# Patient Record
Sex: Male | Born: 1935 | Race: White | Hispanic: No | Smoking: Former smoker
Health system: Southern US, Community
[De-identification: ages and names within clinical notes are randomized; demographics above are authoritative.]

## PROBLEM LIST (undated history)

## (undated) DIAGNOSIS — I10 Essential (primary) hypertension: Secondary | ICD-10-CM

## (undated) DIAGNOSIS — M1990 Unspecified osteoarthritis, unspecified site: Secondary | ICD-10-CM

## (undated) DIAGNOSIS — K219 Gastro-esophageal reflux disease without esophagitis: Secondary | ICD-10-CM

## (undated) DIAGNOSIS — E7800 Pure hypercholesterolemia, unspecified: Secondary | ICD-10-CM

## (undated) DIAGNOSIS — E785 Hyperlipidemia, unspecified: Secondary | ICD-10-CM

## (undated) DIAGNOSIS — I83899 Varicose veins of unspecified lower extremities with other complications: Secondary | ICD-10-CM

## (undated) DIAGNOSIS — K469 Unspecified abdominal hernia without obstruction or gangrene: Secondary | ICD-10-CM

## (undated) DIAGNOSIS — N189 Chronic kidney disease, unspecified: Secondary | ICD-10-CM

## (undated) HISTORY — DX: Essential (primary) hypertension: I10

## (undated) HISTORY — PX: FOOT SURGERY: SHX648

## (undated) HISTORY — DX: Hyperlipidemia, unspecified: E78.5

## (undated) HISTORY — PX: HEMORROIDECTOMY: SUR656

## (undated) HISTORY — DX: Varicose veins of unspecified lower extremity with other complications: I83.899

## (undated) HISTORY — DX: Unspecified abdominal hernia without obstruction or gangrene: K46.9

## (undated) HISTORY — DX: Unspecified osteoarthritis, unspecified site: M19.90

## (undated) HISTORY — DX: Pure hypercholesterolemia, unspecified: E78.00

## (undated) HISTORY — DX: Gastro-esophageal reflux disease without esophagitis: K21.9

## (undated) HISTORY — DX: Chronic kidney disease, unspecified: N18.9

## (undated) HISTORY — PX: APPENDECTOMY: SHX54

## (undated) HISTORY — PX: VEIN LIGATION AND STRIPPING: SHX2653

## (undated) HISTORY — PX: CHOLECYSTECTOMY: SHX55

---

## 1997-06-04 ENCOUNTER — Other Ambulatory Visit: Admission: RE | Admit: 1997-06-04 | Discharge: 1997-06-04 | Payer: Self-pay | Admitting: *Deleted

## 1998-02-17 ENCOUNTER — Emergency Department (HOSPITAL_COMMUNITY): Admission: EM | Admit: 1998-02-17 | Discharge: 1998-02-17 | Payer: Self-pay | Admitting: Emergency Medicine

## 2000-02-08 ENCOUNTER — Emergency Department (HOSPITAL_COMMUNITY): Admission: EM | Admit: 2000-02-08 | Discharge: 2000-02-08 | Payer: Self-pay

## 2000-04-24 ENCOUNTER — Encounter: Payer: Self-pay | Admitting: Surgery

## 2000-04-24 ENCOUNTER — Encounter: Admission: RE | Admit: 2000-04-24 | Discharge: 2000-04-24 | Payer: Self-pay | Admitting: Surgery

## 2000-04-25 ENCOUNTER — Ambulatory Visit (HOSPITAL_BASED_OUTPATIENT_CLINIC_OR_DEPARTMENT_OTHER): Admission: RE | Admit: 2000-04-25 | Discharge: 2000-04-26 | Payer: Self-pay | Admitting: Surgery

## 2000-10-09 ENCOUNTER — Emergency Department (HOSPITAL_COMMUNITY): Admission: EM | Admit: 2000-10-09 | Discharge: 2000-10-09 | Payer: Self-pay | Admitting: Emergency Medicine

## 2001-10-23 ENCOUNTER — Emergency Department (HOSPITAL_COMMUNITY): Admission: EM | Admit: 2001-10-23 | Discharge: 2001-10-23 | Payer: Self-pay | Admitting: Emergency Medicine

## 2001-10-23 ENCOUNTER — Encounter: Payer: Self-pay | Admitting: Emergency Medicine

## 2003-10-04 ENCOUNTER — Emergency Department (HOSPITAL_COMMUNITY): Admission: EM | Admit: 2003-10-04 | Discharge: 2003-10-04 | Payer: Self-pay | Admitting: Emergency Medicine

## 2003-10-23 ENCOUNTER — Encounter (INDEPENDENT_AMBULATORY_CARE_PROVIDER_SITE_OTHER): Payer: Self-pay | Admitting: Specialist

## 2003-10-23 ENCOUNTER — Inpatient Hospital Stay (HOSPITAL_COMMUNITY): Admission: EM | Admit: 2003-10-23 | Discharge: 2003-10-28 | Payer: Self-pay | Admitting: Emergency Medicine

## 2004-03-07 ENCOUNTER — Emergency Department (HOSPITAL_COMMUNITY): Admission: EM | Admit: 2004-03-07 | Discharge: 2004-03-07 | Payer: Self-pay | Admitting: Emergency Medicine

## 2004-07-13 ENCOUNTER — Ambulatory Visit (HOSPITAL_COMMUNITY): Admission: RE | Admit: 2004-07-13 | Discharge: 2004-07-13 | Payer: Self-pay | Admitting: Gastroenterology

## 2004-09-30 ENCOUNTER — Inpatient Hospital Stay (HOSPITAL_COMMUNITY): Admission: RE | Admit: 2004-09-30 | Discharge: 2004-10-02 | Payer: Self-pay | Admitting: Internal Medicine

## 2005-08-23 ENCOUNTER — Emergency Department (HOSPITAL_COMMUNITY): Admission: EM | Admit: 2005-08-23 | Discharge: 2005-08-23 | Payer: Self-pay | Admitting: Emergency Medicine

## 2008-07-30 ENCOUNTER — Emergency Department (HOSPITAL_COMMUNITY): Admission: EM | Admit: 2008-07-30 | Discharge: 2008-07-30 | Payer: Self-pay | Admitting: Emergency Medicine

## 2009-04-07 ENCOUNTER — Emergency Department (HOSPITAL_COMMUNITY): Admission: EM | Admit: 2009-04-07 | Discharge: 2009-04-07 | Payer: Self-pay | Admitting: Emergency Medicine

## 2009-09-27 ENCOUNTER — Emergency Department (HOSPITAL_COMMUNITY): Admission: EM | Admit: 2009-09-27 | Discharge: 2009-09-27 | Payer: Self-pay | Admitting: Emergency Medicine

## 2009-10-20 ENCOUNTER — Inpatient Hospital Stay (HOSPITAL_COMMUNITY)
Admission: EM | Admit: 2009-10-20 | Discharge: 2009-10-24 | Payer: Self-pay | Source: Home / Self Care | Admitting: Emergency Medicine

## 2009-10-22 ENCOUNTER — Encounter (INDEPENDENT_AMBULATORY_CARE_PROVIDER_SITE_OTHER): Payer: Self-pay | Admitting: Internal Medicine

## 2009-11-15 ENCOUNTER — Emergency Department (HOSPITAL_COMMUNITY)
Admission: EM | Admit: 2009-11-15 | Discharge: 2009-11-16 | Payer: Self-pay | Source: Home / Self Care | Admitting: Emergency Medicine

## 2010-05-06 LAB — COMPREHENSIVE METABOLIC PANEL
ALT: 113 U/L — ABNORMAL HIGH (ref 0–53)
ALT: 166 U/L — ABNORMAL HIGH (ref 0–53)
Alkaline Phosphatase: 50 U/L (ref 39–117)
Alkaline Phosphatase: 52 U/L (ref 39–117)
BUN: 13 mg/dL (ref 6–23)
BUN: 17 mg/dL (ref 6–23)
CO2: 26 mEq/L (ref 19–32)
CO2: 28 mEq/L (ref 19–32)
Calcium: 8.9 mg/dL (ref 8.4–10.5)
Chloride: 103 mEq/L (ref 96–112)
Chloride: 105 mEq/L (ref 96–112)
GFR calc Af Amer: >60 mL/min (ref >60)
GFR calc non Af Amer: 47 mL/min — ABNORMAL LOW (ref >60)
GFR calc non Af Amer: 50 mL/min — ABNORMAL LOW (ref >60)
Glucose, Bld: 109 mg/dL — ABNORMAL HIGH (ref 70–99)
Glucose, Bld: 99 mg/dL (ref 70–99)
Potassium: 3.6 mEq/L (ref 3.5–5.1)
Sodium: 138 mEq/L (ref 135–145)
Total Bilirubin: 1.4 mg/dL — ABNORMAL HIGH (ref 0.3–1.2)
Total Protein: 6.1 g/dL (ref 6.0–8.3)
Total Protein: 6.3 g/dL (ref 6.0–8.3)

## 2010-05-06 LAB — DIFFERENTIAL
Basophils Absolute: 0 10*3/uL (ref 0.0–0.1)
Basophils Relative: 1 % (ref 0–1)
Basophils Relative: 0 % (ref 0–1)
Eosinophils Absolute: 0.2 10*3/uL (ref 0.0–0.7)
Lymphocytes Relative: 27 % (ref 12–46)
Lymphocytes Relative: 17 % (ref 12–46)
Lymphs Abs: 1.4 10*3/uL (ref 0.7–4.0)
Monocytes Absolute: 0.4 10*3/uL (ref 0.1–1.0)
Monocytes Absolute: 0.4 10*3/uL (ref 0.1–1.0)
Neutro Abs: 3.3 10*3/uL (ref 1.7–7.7)

## 2010-05-06 LAB — POCT I-STAT, CHEM 8
BUN: 30 mg/dL — ABNORMAL HIGH (ref 6–23)
Calcium, Ion: 1.23 mmol/L (ref 1.12–1.32)
Creatinine, Ser: 1.7 mg/dL — ABNORMAL HIGH (ref 0.4–1.5)
Glucose, Bld: 107 mg/dL — ABNORMAL HIGH (ref 70–99)
Potassium: 4.2 mEq/L (ref 3.5–5.1)
Sodium: 140 mEq/L (ref 135–145)

## 2010-05-06 LAB — CBC
HCT: 36.2 % — ABNORMAL LOW (ref 39.0–52.0)
HCT: 36.3 % — ABNORMAL LOW (ref 39.0–52.0)
HCT: 36.6 % — ABNORMAL LOW (ref 39.0–52.0)
Hemoglobin: 12.6 g/dL — ABNORMAL LOW (ref 13.0–17.0)
Hemoglobin: 12.8 g/dL — ABNORMAL LOW (ref 13.0–17.0)
MCH: 31.8 pg (ref 26.0–34.0)
MCH: 31.8 pg (ref 26.0–34.0)
MCH: 32 pg (ref 26.0–34.0)
MCHC: 34.9 g/dL (ref 30.0–36.0)
MCV: 90.5 fL (ref 78.0–100.0)
MCV: 90.9 fL (ref 78.0–100.0)
MCV: 91.1 fL (ref 78.0–100.0)
MCV: 91.7 fL (ref 78.0–100.0)
Platelets: 132 10*3/uL — ABNORMAL LOW (ref 150–400)
Platelets: 114 10*3/uL — ABNORMAL LOW (ref 150–400)
RBC: 3.95 MIL/uL — ABNORMAL LOW (ref 4.22–5.81)
RBC: 3.97 MIL/uL — ABNORMAL LOW (ref 4.22–5.81)
RBC: 4.02 MIL/uL — ABNORMAL LOW (ref 4.22–5.81)
RDW: 13 % (ref 11.5–15.5)
WBC: 4.8 10*3/uL (ref 4.0–10.5)
WBC: 6.2 10*3/uL (ref 4.0–10.5)

## 2010-05-06 LAB — PROTIME-INR
INR: 1.13 (ref 0.00–1.49)
Prothrombin Time: 14.7 seconds (ref 11.6–15.2)

## 2010-05-06 LAB — POCT CARDIAC MARKERS
CKMB, poc: <1 ng/mL — ABNORMAL LOW (ref 1.0–8.0)
Myoglobin, poc: 95.6 ng/mL (ref 12–200)
Troponin i, poc: <0.05 ng/mL (ref 0.00–0.09)

## 2010-05-06 LAB — ABO/RH: ABO/RH(D): O NEG

## 2010-05-07 LAB — CBC
HCT: 41 % (ref 39.0–52.0)
Hemoglobin: 14.3 g/dL (ref 13.0–17.0)
MCV: 90.6 fL (ref 78.0–100.0)
MCV: 91 fL (ref 78.0–100.0)
Platelets: 141 10*3/uL — ABNORMAL LOW (ref 150–400)
Platelets: 141 10*3/uL — ABNORMAL LOW (ref 150–400)
RBC: 4.53 MIL/uL (ref 4.22–5.81)
RBC: 4.65 MIL/uL (ref 4.22–5.81)
RDW: 13.2 % (ref 11.5–15.5)
WBC: 5.2 10*3/uL (ref 4.0–10.5)
WBC: 5.8 10*3/uL (ref 4.0–10.5)

## 2010-05-07 LAB — COMPREHENSIVE METABOLIC PANEL
AST: 397 U/L — ABNORMAL HIGH (ref 0–37)
Albumin: 3.8 g/dL (ref 3.5–5.2)
Albumin: 3.9 g/dL (ref 3.5–5.2)
Alkaline Phosphatase: 48 U/L (ref 39–117)
Alkaline Phosphatase: 50 U/L (ref 39–117)
BUN: 17 mg/dL (ref 6–23)
BUN: 21 mg/dL (ref 6–23)
CO2: 28 mEq/L (ref 19–32)
Chloride: 105 mEq/L (ref 96–112)
Chloride: 106 mEq/L (ref 96–112)
Creatinine, Ser: 1.61 mg/dL — ABNORMAL HIGH (ref 0.4–1.5)
GFR calc Af Amer: 50 mL/min — ABNORMAL LOW (ref >60)
GFR calc non Af Amer: 45 mL/min — ABNORMAL LOW (ref >60)
Glucose, Bld: 110 mg/dL — ABNORMAL HIGH (ref 70–99)
Potassium: 4 mEq/L (ref 3.5–5.1)
Potassium: 4.2 mEq/L (ref 3.5–5.1)
Total Bilirubin: 3.9 mg/dL — ABNORMAL HIGH (ref 0.3–1.2)
Total Protein: 7.8 g/dL (ref 6.0–8.3)

## 2010-05-07 LAB — URINALYSIS, ROUTINE W REFLEX MICROSCOPIC
Glucose, UA: NEGATIVE mg/dL
Hgb urine dipstick: NEGATIVE
Specific Gravity, Urine: 1.013 (ref 1.005–1.030)

## 2010-05-07 LAB — DIFFERENTIAL
Eosinophils Relative: 2 % (ref 0–5)
Lymphocytes Relative: 22 % (ref 12–46)
Monocytes Absolute: 0.4 10*3/uL (ref 0.1–1.0)
Monocytes Relative: 6 % (ref 3–12)
Neutro Abs: 4 10*3/uL (ref 1.7–7.7)

## 2010-05-31 LAB — CBC
Hemoglobin: 14.8 g/dL (ref 13.0–17.0)
Platelets: 132 10*3/uL — ABNORMAL LOW (ref 150–400)
RDW: 13.3 % (ref 11.5–15.5)

## 2010-05-31 LAB — BASIC METABOLIC PANEL
Calcium: 9.6 mg/dL (ref 8.4–10.5)
GFR calc non Af Amer: 42 mL/min — ABNORMAL LOW (ref >60)
Glucose, Bld: 122 mg/dL — ABNORMAL HIGH (ref 70–99)
Sodium: 137 mEq/L (ref 135–145)

## 2010-07-09 NOTE — Discharge Summary (Signed)
NAMEOVIDIO, STEELE             ACCOUNT NO.:  [id number]   MEDICAL RECORD NO.:  [id number]          PATIENT TYPE:  INP   LOCATION:  5743                         FACILITY:  [facility]   PHYSICIAN:  Ollen Gross. Vernell Morgans, M.D. DATE OF BIRTH:  [birth year]   DATE OF ADMISSION:  10/23/2003  DATE OF DISCHARGE:  10/28/2003                                 DISCHARGE SUMMARY   HISTORY OF PRESENT ILLNESS:  Ronald [name] is a 77 year old white male who  presented with right lower quadrant pain.  He was found to have  appendicitis.  He was taken to the operating room, where attempts were made  at a laparoscopic appendectomy, but were unsuccessful, and a right lower  quadrant transverse incision had to be made for an open appendectomy.  Postoperatively, his wound was left open, and dressings were started.  He  did have a little bit of ileus after surgery, but his diet was able to  gradually be advanced.  He was continued on some antibiotics, and did well,  and on October 28, 2003, he was tolerating a diet, and arrangements were  made for home health nursing to do dressing changes for him at home, and he  was discharged home.   DISCHARGE MEDICATIONS:  1.  He was given a prescription for Vicodin for pain.  2.  He is to resume his home medications.   ACTIVITY:  No heavy lifting.   DIET:  As tolerated.   FINAL DIAGNOSIS:  Appendicitis.   FOLLOW UP:  With Dr. Carolynne Edouard in the next week.   DISPOSITION:  He is discharged home.      Renae Fickle   PST/MEDQ  D:  01/20/2004  T:  01/20/2004  Job:  [id number]

## 2010-07-09 NOTE — Op Note (Signed)
[name], Ronald             ACCOUNT NO.:  [id number]   MEDICAL RECORD NO.:  [id number]          PATIENT TYPE:  AMB   LOCATION:  ENDO                         FACILITY:  [facility]   PHYSICIAN:  Petra Kuba, M.D.    DATE OF BIRTH:  Aug 31, 1929   DATE OF PROCEDURE:  07/13/2004  DATE OF DISCHARGE:                                 OPERATIVE REPORT   PROCEDURE:  Colonoscopy.   INDICATIONS FOR PROCEDURE:  Bright red blood per rectum, history of colon  polyps probably due for a repeat screening.   Consent was signed after risks, benefits, methods, and options were  thoroughly discussed in the office.   MEDICINES USED:  Demerol 40, Versed 5.   DESCRIPTION OF PROCEDURE:  Rectal inspection was pertinent for external  hemorrhoids, small. Digital exam was negative. The video pediatric  adjustable colonoscope was inserted, easily advanced around the colon to the  cecum. This did not require any abdominal pressure or any position changes.  Other than occasional left sided diverticula, no abnormality was seen on  insertion. The scope was slowly withdrawn. The cecum was identified by the  appendiceal orifice and the ileocecal valve. The prep was adequate. There  was minimal liquid stool that required washing and suctioning. On slow  withdrawal through the colon, other than the left sided occasional  diverticula, no other abnormalities were seen. Specifically no polyps,  tumors or masses. Once back in the rectum, anal rectal pullthrough and  retroflexion confirmed some small hemorrhoids. The scope was straightened,  readvanced a short ways up the left side of the colon, air was suctioned,  scope removed. The patient tolerated the procedure well. There was no  obvious or immediate complication.   ENDOSCOPIC DIAGNOSIS:  1.  Internal and external hemorrhoids.  2.  Occasional diverticula.  3.  Otherwise within normal limits to the cecum.   PLAN:  Recheck colon screening in five years. Happy to  see back p.r.n.  otherwise return care to Dr. Clarene Duke for the customary health care  maintenance to include yearly rectal's and guaiac's, consideration of trying  MiraLax, etc. and treating hemorrhoids in the usual fashion.      MEM/MEDQ  D:  07/13/2004  T:  07/13/2004  Job:  [id number]   cc:   Caryn Bee L. Little, M.D.  [address]  [place]  [place] [address]  Fax: [phone]

## 2010-07-09 NOTE — Op Note (Signed)
[name], [name]                  ACCOUNT NO.:  [id number]   MEDICAL RECORD NO.:  [id number]                   PATIENT TYPE:  INP   LOCATION:  5743                                 FACILITY:  [facility]   PHYSICIAN:  Ollen Gross. Vernell Morgans, M.D.              DATE OF BIRTH:  October 11, 1929   DATE OF PROCEDURE:  10/23/2003  DATE OF DISCHARGE:  10/28/2003                                 OPERATIVE REPORT   PREOPERATIVE DIAGNOSIS:  Appendicitis.   POSTOPERATIVE DIAGNOSIS:  Appendicitis.   PROCEDURE:  Aborted laparoscopic and subsequent open appendectomy.   SURGEON:  Ollen Gross. Carolynne Edouard, M.D.   ANESTHESIA:  General endotracheal anesthesia.   PROCEDURE:  After informed consent was obtained, the patient was brought to  the operating room and placed in the supine position on the operating table.  After induction of general anesthesia, the patient's abdomen was prepped  with Betadine and draped in the usual sterile manner.  The area below the  umbilicus was infiltrated with 0.25% Marcaine.  A small incision was made  with a 15 blade knife, this incision was carried down through the  subcutaneous tissue bluntly using a Kelly clamp and [employer] retractors  until the linea alba was identified.  The linea alba was incised with a 15  blade knife.  Each side was grasped with Kocher clamps and elevated  anteriorly.  The preperitoneal space was then probed bluntly with a hemostat  until the peritoneum was opened and access was gained to the abdominal  cavity.  A 0 Vicryl purse-string stitch was placed in the fascia surrounding  the opening.  A Hasson cannula was placed through the opening and anchored  in place with the previously placed Vicryl purse-string stitch.  The abdomen  was then insufflated with carbon dioxide without difficulty.  The  laparoscope was placed through the Hasson cannula and the right lower  quadrant was inspected.  The patient was placed in Trendelenburg position  and rotated with  his right side up.  The appendix was able to be identified  but appeared to be very adherent to the pelvic sidewall.  Just below the  umbilicus, another area was infiltrated with 0.25% Marcaine.  A small  incision was made with a 15 blade knife and a 5 mm port was placed bluntly  through this incision into the abdominal cavity under direct vision.  In the  suprapubic area, another area was infiltrated with 0.25% Marcaine.  A small  incision was made with a 15 blade knife and an 11-12 mm port was placed  percutaneously through the abdominal wall into the abdominal cavity under  direct vision without difficulty.  Blunt dissection was attempted to free  the appendix, but it was so densely adherent that this was unsuccessful and,  at this point, the laparoscopic portion of the procedure was aborted.   A right lower quadrant transverse incision was made sharply with the 10  blade knife.  The  incision was carried down through the skin and  subcutaneous tissues sharply using the Bovie electrocautery.  The fascia and  muscular layers of the anterior abdominal wall were divided also with the  electrocautery until entry was made into the abdominal cavity.  The cecum  was mobilized from its retroperitoneal attachment by incising some of its  retroperitoneal attachment along the white line of Toldt.  This allowed the  cecum to be brought up into the wound a little bit better.  The appendix was  very adherent to the pelvic sidewall but was able to be freed up by blunt  finger dissection.  The mesoappendix was then taken down sharply using the  Harmonic scalpel.  Once this was accomplished, the base of the appendix at  its junction with the cecum was identified and cleared of any other tissue  debris.  Laparoscopic GIA stapler with a blue load was placed across the  base of the appendix at its junction with the cecum, clamped, and fired  dividing the base of the appendix at its junction with the cecum  between  staple lines.  The staple lines were then inspected and appeared to be  intact and healthy and hemostatic.  The wound and the rest of the abdomen  was then irrigated with copious amounts of saline.  The fascia of the  anterior abdominal wall was closed in two layers of running #1 PDS suture,  each layer was irrigated as it was closed with saline and Betadine.  The  skin was closed with staples as well as the rest of the laparoscopic  incisions all closed with staples.  The ports were all removed and found to  be hemostatic.  The fascial defect at the infraumbilical port was closed  with the previously placed Vicryl purse-string stitch as well as with  another interrupted 0 Vicryl stitch.  Sterile dressings were applied.  The  patient tolerated the procedure well.  At the end of the case, all needle,  sponge, and instrument counts were correct.  The patient was awakened and  taken to the recovery room in stable condition.                                               Ollen Gross. Vernell Morgans, M.D.    PST/MEDQ  D:  10/28/2003  T:  10/28/2003  Job:  [id number]

## 2010-07-09 NOTE — Discharge Summary (Signed)
NAMEMAEJOR, ERVEN             ACCOUNT NO.:  [id number]   MEDICAL RECORD NO.:  [id number]          PATIENT TYPE:  INP   LOCATION:  5506                         FACILITY:  [facility]   PHYSICIAN:  Melissa L. Ladona Ridgel, MD  DATE OF BIRTH:  Dec 27, 1929   DATE OF ADMISSION:  09/30/2004  DATE OF DISCHARGE:  10/02/2004                                 DISCHARGE SUMMARY   DISCHARGE DIAGNOSES:  1.  Acute on chronic renal insufficiency:  The patient was admitted when his      creatinine was found to be in the 2's by his primary care physician. He      had been in the process of establishing care with a urologist and      nephrologist for recently discovered chronic renal insufficiency with a      PSA of 11.8. His primary care physician had found an increased      creatinine of 2.8 in the outpatient setting which corresponded with what      appeared to be a urinary tract infection. Blood in the urinalysis was      large with small leukocyte esterase, too numerous to count cells were      identified. It appears that someone started him on Cipro for this,      although the patient and his daughter could not tell me when or who had      done that. The patient appears to have been taking Sulindac at home      which was discontinued, although the patient still list it on his      medication list. The patient is scheduled to see Dr. Jasper Riling on Monday,      October 04, 2004. At this time, with gentle hydration, the patient's      creatinine has decreased to 1.7. I therefore feel comfortable in      discharging him home to follow up on Monday with Dr. Jasper Riling.  2.  Elevated prostate specific antigen:  The patient was noted in the      outpatient setting to have a PSA of 11.8 and in the face of his urinary      issues was scheduled to see a urologist, namely Dr. Bertram Millard.      Dahlstedt, on October 08, 2004. We will encourage him to keep this      appointment. I did request an inpatient urology consult while in  the      hospital in order to establish interventional issues, namely urinary      retention. At this time, urology did not feel a Foley catheter or any      other intervention was appropriate and that biopsies will likely need to      be done in the outpatient setting. Please note that the patient's rectal      exam was documented by the urologist who felt that it was smooth and      symmetrical but had slight asymmetric feeling at S1 described as firm on      the left.  3.  Dyslipidemia:  The patient will continue with  Lopid on Niaspan and fish      oil.  4.  History of arthritis at this time:  I will request that the patient      discontinue Advil and Sulindac but he can use Tylenol. His primary care      physician may need to address this depending on his renal dysfunction      and assign another pain medication for him.  5.  The patient does have a history of hematochezia and was evaluated by Dr.      Petra Kuba at this time. This problem is stable from our perspective.  6.  Nausea and vomiting: This symptomatology was likely secondary to the      urinary tract infection with acute renal dysfunction. With hydration,      the patient is now eating without complaint of nausea or vomiting.   DISCHARGE MEDICATIONS:  1.  Nexium 40 mg p.o. daily.  2.  Zoloft 25 mg p.o. daily.  3.  Lopid 600 mg p.o. daily.  4.  Niaspan may be continued at the home dose as the patient has now      provided that to me.  5.  Fish oil which may be continued at the home dose.  6.  I have asked him to hold his hydrochlorothiazide and not to take his      Sulindac or Advil-related medication.  7.  The only other medication will be to continue Cipro 500 mg p.o. 24h. not      b.i.d. because of his decreased creatinine clearance.   HISTORY OF PRESENT ILLNESS:  The patient is a 77 year old Caucasian  gentleman who presented to his primary care physician with weakness and  fatigue, progressively worsening  appetite for an approximately four week  period. On September 29, 2004, as a result of those symptoms, the patient went  to see the [facility] physician and blood work was completed  showing a BUN of 84 and a creatinine of 2.8. His potassium was slightly  elevated at 5.9. Additionally, his PSA was noted to be 11.8. The patient was  scheduled for urology outpatient consult as well as nephrology consult. At  that time, he also was noted to probably a UTI and it appears that he may  have been started on ciprofloxacin. The patient then returned to his primary  care physician who repeated some testing and found his BUN to be 85 with a  creatinine of 2.6. He therefore was advised to come to the hospital for  management of acute renal failure.   The patient was admitted to the general medical floor, evaluated by the  hospitalist service. Started on gentle rehydration with good results. The  patient's nausea and vomiting cleared during the course of the hospital  stay. He was able to resume eating quite aggressively. The only complaint  that he had was that his bowel movements had been altered. He is used to  going twice a day and here he has not gone in one day. The patient's  creatinine has decreased down to 1.7 with minimal IV hydration; therefore, I  feel that it is safe for this patient to return to an outpatient urologist  as well as nephrologist for which he has scheduled an appointment on Monday.  At this time, we will provide the patient with a Dulcolax suppository to see  if he can move his bowels before going to home. I will also advise him on  other  medications that he can use at home if he is unsuccessful in moving  his bowels.   On the day of discharge, the patient's vital signs have remained stable.  Temperature 97.5, blood pressure 118/65, pulse 56, respirations 20. In  general, he is a well-developed, well-nourished white male in no acute distress. Pupils are equal,  round, and reactive to light. Extraocular  movements intact. Moist mucus membranes. Neck is supple. There is no JVD, no  lymph nodes, and no carotid bruits. His chest is clear to auscultation with  no rhonchi, rales, or wheezes. Cardiovascular is regular rate and rhythm.  Positive S1 and S2. No S3, S4. No murmurs, gallops, or rubs. Extremities  show obvious deformities related to his arthritis but no clubbing, cyanosis,  or edema. Neurologically, he is nonfocal. The patient does have an unusual  tick in that he feels he needs to clear his nose; therefore, there is a lot  of snuffing and facial movement to do this. It does appear to be tic,  however, because he cannot bring it under control.   His pertinent labs on admission showed creatinine was 2.3 with a BUN of 72.  After gentle hydration, his BUN is 54 with a creatinine of 1.7. His  potassium at discharge is 4.4 with a sodium of 139, chloride of 112, CO2 23,  calcium is 8.4. Random creatinine 30.1 with a random urine sodium of 67,  total protein of less than 6 at random. TSH was 0.413 and a renal ultrasound  was completed which showed no obvious obstruction or abnormalities.  Urinalysis is consistent with possible UTI with leukocyte esterase at trace  and wbc's at 3.6 which is definitively improved from the findings sent to [place]  by the [facility] doctor where his cells were too numerous to count.   At this time, I feel the patient is stable for discharge to home to follow  up with Dr. Jasper Riling on Monday. I will leave my cell phone number here for Dr.  Jasper Riling to call when he receives this fax. Otherwise, I will attempt to get  him on Monday. My cell phone number, Dr. Efraim Kaufmann L. Ladona Ridgel, is [phone].  Please do not hesitate to call me if you receive this before I speak with  you.      Melissa L. Ladona Ridgel, MD  Electronically Signed     MLT/MEDQ  D:  10/02/2004  T:  10/02/2004  Job:  [id number]   cc:   Jasper Riling, M.D.  [phone]   Bertram Millard. Dahlstedt, M.D.  [address]  [place]  [place] [address]  Fax: [phone]

## 2010-07-09 NOTE — H&P (Signed)
Ronald [name]                  ACCOUNT NO.:  [id number]   MEDICAL RECORD NO.:  [id number]                   PATIENT TYPE:  INP   LOCATION:  1824                                 FACILITY:  [facility]   PHYSICIAN:  Ollen Gross. Vernell Morgans, M.D.              DATE OF BIRTH:  1929-06-10   DATE OF ADMISSION:  10/22/2003  DATE OF DISCHARGE:                                HISTORY & PHYSICAL   [name] is a 77 year old white male who presents with right lower  quadrant abdominal pain that started this morning.  The pain has been  localized to his right lower quadrant.  He has not had any nausea or fevers  associated with this.  He has had no problems with diarrhea.  He was sent by  his primary care doctor to the emergency department for further evaluation.   REVIEW OF SYSTEMS:  He otherwise denies any nausea, vomiting, fevers,  chills, chest pain, shortness of breath, diarrhea, dysuria.  The rest of his  review of systems is unremarkable.   PAST MEDICAL HISTORY:  Hypertension.   PAST SURGICAL HISTORY:  1.  Multiple hand surgeries.  2.  Bilateral inguinal hernia repairs.   MEDICATIONS:  Unknown hypertension medicine.   ALLERGIES:  No known drug allergies.   SOCIAL HISTORY:  He denies the use of tobacco and only occasionally drinks  alcohol.   FAMILY HISTORY:  Noncontributory.   PHYSICAL EXAMINATION:  VITAL SIGNS:  His temp is 100, blood pressure 158/71,  pulse of 80.  GENERAL:  He is a well-developed, well-nourished, elderly, white male in no  acute distress.  SKIN:  Warm and dry with no jaundice.  EYES:  His extraocular muscles are intact.  Pupils equal, round and reactive  to light.  Sclerae are not icteric.  LUNGS:  Clear bilaterally with no use of accessory respiratory muscles.  HEART:  Regular rate and rhythm with an impulse in the left chest.  ABDOMEN:  Soft with focal right lower quadrant tenderness but no guarding or  peritoneal signs.  No palpable mass or  hepatosplenomegaly.  EXTREMITIES:  No cyanosis, clubbing, or edema.  He does have some  deformities of his hands and feet.  PSYCHOLOGIC:  He is alert and oriented x 3 with no evidence today of anxiety  or depression.   LAB WORK:  Reviewed and was significant for a white count of 14.5.   His CT scan was reviewed and was significant for acute appendicitis with no  evidence of perforation.   ASSESSMENT:  This is a 77 year old white male with what appears to be acute  appendicitis.   PLAN:  I recommend he have an appendectomy tonight in the operating room.  I  have explained to him and his family in detail the risks and benefits of the  operation as well as some of the technical aspects, and they understand and  wish to proceed.  We will plan for this in  the operating room tonight.                                                Ollen Gross. Vernell Morgans, M.D.    PST/MEDQ  D:  10/23/2003  T:  10/23/2003  Job:  [id number]

## 2010-07-09 NOTE — Consult Note (Signed)
Ronald [name], Ronald [name]             ACCOUNT NO.:  [id number]   MEDICAL RECORD NO.:  [id number]          PATIENT TYPE:  INP   LOCATION:  5506                         FACILITY:  [facility]   PHYSICIAN:  Ronald [name], M.D.  DATE OF BIRTH:  [birth year]   DATE OF CONSULTATION:  10/01/2004  DATE OF DISCHARGE:                                   CONSULTATION   HISTORY OF PRESENT ILLNESS:  The patient is a 77 year old white male patient  of Dr. Catha [name], seen today for further evaluation of an elevated PSA  that was noted on outpatient blood work. I was asked to see the patient  since he has been admitted to the hospital. The patient apparently has also  had some weight loss and noted feeling weak and fatigued. He has had some  loss of appetite. The creatinine was found to be elevated to 2.8. In talking  to the patient, he has no history of gross hematuria. Never had a urinary  tract infection or prostatitis. Denies history of kidney stones and has no  obstructive or irritative voiding symptoms. He does not know of his previous  PSA values.   PAST MEDICAL HISTORY:  He has arthritis, dyslipidemia, hypertension, and  Dupuytren's contracture.   PAST SURGICAL HISTORY:  He has had surgery for his Dupuytren's contracture  on his hand. Bilateral hernia repair and appendectomy.   ALLERGIES:  No known drug allergies but has intolerance to MORPHINE.   MEDICATIONS:  Ciprofloxacin, Nexium, Zoloft, Lopid, Sulindac, HCTZ.   FAMILY HISTORY:  Negative for GU malignancy or renal disease.   SOCIAL HISTORY:  The patient denies tobacco use. Drinks alcohol socially.   REVIEW OF SYSTEMS:  Positive for his loss of appetite and weakness as well  as weight loss. Otherwise, his review of systems is completely negative.   PHYSICAL EXAMINATION:  VITAL SIGNS:  Blood pressure 123/54, pulse 59,  temperature 97.3.  GENERAL:  The patient is a well developed, well nourished, white male in no  apparent distress.  HEENT:  Normocephalic and atraumatic. Oropharynx is clear.  NECK:  Supple with midline trachea.  CHEST:  Normal respiratory effort.  CARDIOVASCULAR:  Regular rate and rhythm with occasional extra beat.  ABDOMEN:  Soft, nontender, without masses or hepatosplenomegaly. He has a  deep scar in the right lower quadrant. Bilateral inguinal hernia scars are  noted. He had no inguinal hernias or adenopathy.  GENITOURINARY:  Examination reveals normal uncircumcised phallus with normal  glands. He had a scrotum test with epididymis.  RECTAL:  Normal sphincter tone. His prostate is smooth to slightly  asymmetric. The left side is a [name] smoother and more bulbar that the  right, which is a [name] flatter. The right side also seemed just slightly  firmer but had no nodularity, induration, or worrisome masses. The prostate  is not fixed. There is no seminal vesicle abnormality. No rectal masses were  noted.  EXTREMITIES:  Reveal severe Dupuytren's contracture of both hands and feet.  Otherwise, there is no clubbing, cyanosis, or edema.  NEUROLOGIC:  No gross focal neurologic deficits.   LABORATORY DATA:  Creatinine is 2.6 and his alkaline phosphatase is normal  at 86 with a PSA of 11.8.   CT scan was reviewed. The kidney's appear normal bilaterally. I see no  stones, masses, or hydronephrosis. No hydro-ureter. The bladder appears  normal. Prostate is also unremarkable.   IMPRESSION:  Elevated PSA of 11.8 with no voiding symptoms and a prostate  that is slightly asymmetric but is not particularly worrisome. I feel that  it would be highly unlikely that his weight loss would be in any way related  to a PSA of only 11.8. There is a possibility of prostate cancer but if  metastatic and causing weight loss, it would be several magnitudes higher  than that. Because his prostate is slightly asymmetric though, I think  further evaluation with trans-rectal ultrasound and biopsy may be indicated.  I  discussed that with the patient today. This cannot be done at [facility] due to their lack of trans-rectal ultrasound capabilities.   PLAN:  I have given the patient my card and will plan to see him back as an  outpatient. In the meantime, I am going to try to get his previous PSA's  from Dr. Clarene [name], in order to evaluate any PSA trend.      Ronald [name], M.D.  Electronically Signed     MCO/MEDQ  D:  10/01/2004  T:  10/02/2004  Job:  [id number]   cc:   Ronald [name], M.D.  [address]  [place]  [place] [address]  Fax: [phone]

## 2010-07-09 NOTE — H&P (Signed)
Ronald [name], Ronald [name]             ACCOUNT NO.:  [id number]   MEDICAL RECORD NO.:  [id number]          PATIENT TYPE:  INP   LOCATION:  5506                         FACILITY:  [facility]   PHYSICIAN:  Jackie Plum, M.D.DATE OF BIRTH:  05/03/1929   DATE OF ADMISSION:  09/30/2004  DATE OF DISCHARGE:                                HISTORY & PHYSICAL   CHIEF COMPLAINT:  Weakness, decreased appetite and abnormal chemistries.   HISTORY OF THE PRESENT ILLNESS:  The patient is a 77 year old Caucasian  gentleman who is admitted by his PCP's partner, Dr. Leatrice Jewels, today due  to the above complaints. According to the patient and his daughter he had be  well until about four weeks ago when he started to feel weak and fatigued  with progressive worsening of loss of appetite.  He also had been feeling  nauseated without vomiting.  He has lost an unknown amount of weight over  about a four-week period of time now.  On account of his progressive  weakness, nausea and weight loss he went to [facility], September 29, 2004,  whereupon blood work done indicated a BUN of 84, creatinine of 2.8 and  potassium of 5.9.  In addition, his PSA was done and was elevated to 11.8,  and an appointment with the urologist for consultation was arranged for him  as an outpatient.  His lab work  does indicate a WBC panel of 12.3 and a  hemoglobin of 13.4.  His systolic blood pressure in urgent care is said to  be in the hundreds.  The patient went to see his PCP today, and apparently  they found that his BUN was 85 with a creatinine of 2.6, with a normal  calcium level.  He therefore was advised for admission for further  management of acute renal failure.   The patient denies any chest pain, shortness of breath, extremity weakness,  PND, orthopnea, dysuria, problems with micturition, abdominal pain, and heat  or cold intolerance.   PAST MEDICAL HISTORY:  The past medical history is significant for:  1.  History of  arthritis.  2.  Dyslipidemia.  3.  Hypertension.  4.  On reviewing his E chart the patient had a coloscopy by Dr. Vida Rigger      in May 2006 for hematochezia/history of colon polyps.  This study also      revealed internal and external hemorrhoids with occasional diverticula.  5.  In addition, the patient was admitted in November 2005 for acute      appendicitis.  6.  The patient is status post appendectomy.  7.  Multiple hand surgeries for arthritis; and,  8.  Bilateral inguinal hernia repair.   ALLERGIES:  The patient is allergic to MORPHINE, which really sounds like  MORPHINE INTOLERANCE rather than a real allergy.   FAMILY HISTORY:  The family history insignificant because of is advanced  age.   SOCIAL HISTORY:  The patient lives in his own house with his daughter.  He  does not smoke cigarettes.  He does drink alcohol on a social basis.   REVIEW OF SYSTEMS:  The review of systems is negative other than that  mentioned in the HPI, which is unremarkable.   PHYSICAL EXAMINATION:  VITAL SIGNS:  Blood pressure is 123/54, pulse 59,  respirations 30, temperature 97.9 degrees Fahrenheit, and O2 saturation of  96% on room air.  GENERAL APPEARANCE:  On exam he is nontoxic-looking and he is not in acute  pulmonary distress.  HEENT:  Normocephalic and atraumatic.  Pupils are equal, round and react to  light.  Extraocular movements and tympanic membranes are intact.  Oropharynx  is dry with no exudate  or erythema.  NECK:  The neck is supple with no JVD.  LUNGS:  The lungs are clear to auscultation.  HEART:  Cardiac exam is regular with occasional extrasystoles.  No gallops  or murmurs appreciated.  ABDOMEN: The abdomen is soft and nontender.  No organomegaly is appreciated.  Bowel sounds are present.  EXTREMITIES:  There is no obvious edema.  No cyanosis.  NEUROLOGIC: The CNS exam is nonfocal.   LABORATORY DATA:  Lab work from his doctor's office was reviewed.  His  glucose was  127, BUN 84, creatinine 2.6, sodium 135, potassium 4.8, chloride  100, CO2 24, and calcium 9.7.  Anion gap of 15.9 today.  His complete panel  from [facility] done yesterday indicated a BUN of 84 as mentioned above,  creatinine 2.8, BUN to creatinine ratio 30, sodium 158, potassium 5.9,  chloride was 102, O2 was 24, and calcium 10.9.  Total protein 7.4, albumin  4.0, bilirubin 0.5, alkaline phosphatase 86, AST 27, and ALT 40.  Creatinine  kinase 22.  PSA was 11.8.  ESR was elevated at 88 with a TSH of 0.342, which  is marginally low.   IMPRESSION:  1.  Acute renal failure; sounds to be prerenal.     likely secondary to ACE inhibitor and nonsteroidal analgesics in concert  with diuresis and decreased p.o. intake with dehydration.   1.  Hypercholesterolemia, which has resolved.  2.  Elevated prostatic specific antigen.  3.  Rule out any occult malignancy especially with weight loss for four      weeks.   PLAN:  1.  The patient will be admitted to the hospital.  2.  We will initiate renal workup with renal ultrasound.  3.  Review electrolytes.  4.  Portable x-ray and a 12-lead EKG for completeness sake.  5.  We will hydrate him and follow his renal function carefully.  6.  The patient's TSH, which is marginally low  may be related his acute      illness. Elevated ESR may be due to occlut malignancy.  7.  Abdominal CT may be help should  his creatinine return to normal limits      with hydration.  8.  The patient looks clinically more stable than his laboratories would      suggest; and, hopefully he will continue to do well with IV hydration      and other supportive measures.   The patient has a history of arthritis.  It is not clear what type of  arthritis he has.  He has been going to [facility] for his care, but  his hands indicates some arthritic deformities; and,  I cannot discern exactly what type of arthritis he has, but it looks to me that it may be  some form of  rheumatoid arthritis.   We will obtain follow up testing.  We will send a CBC and BMET in  the  morning. No need for repeat testing tonight at this time.       GO/MEDQ  D:  09/30/2004  T:  10/01/2004  Job:  [id number]

## 2010-09-21 ENCOUNTER — Encounter: Payer: Self-pay | Admitting: Vascular Surgery

## 2010-09-22 ENCOUNTER — Ambulatory Visit (INDEPENDENT_AMBULATORY_CARE_PROVIDER_SITE_OTHER): Payer: Medicare Other | Admitting: Vascular Surgery

## 2010-09-22 ENCOUNTER — Encounter (INDEPENDENT_AMBULATORY_CARE_PROVIDER_SITE_OTHER): Payer: Medicare Other

## 2010-09-22 ENCOUNTER — Encounter: Payer: Self-pay | Admitting: Vascular Surgery

## 2010-09-22 VITALS — BP 135/66 | HR 58 | Resp 16 | Ht 70.0 in | Wt 185.0 lb

## 2010-09-22 DIAGNOSIS — I83893 Varicose veins of bilateral lower extremities with other complications: Secondary | ICD-10-CM

## 2010-09-22 NOTE — Progress Notes (Signed)
VV intial consult.  Pt. states about 3 weeks ago had a bleeding episode above right ankle.

## 2010-09-23 NOTE — Consult Note (Signed)
NEW PATIENT CONSULTATION  Ronald [name], Ronald [name] DOB:  02-14-1930                                       09/22/2010 NWGNF#:[id number]  Patient presents today for evaluation of his venous pathology.  He had undergone vein stripping many years ago of his right great saphenous vein at the [facility].  He reports that approximately 7 years ago, he had several episodes of bleeding from his lateral ankle with some superficial telangiectasia.  He has had no trouble with it since that time.  Three weeks ago he had another episode of bleeding from the superficial telangiectasia and is here today for discussion of this. This stopped quite easily with pressure, and he has had no recurrent bleeding since that time.  He has no pain associated with this.  PAST MEDICAL HISTORY:  Significant for hypertension, elevated cholesterol.  SOCIAL HISTORY:  He is widowed.  He is retired.  He quit smoking in 1980.  He does not drink alcohol.  REVIEW OF SYSTEMS:  Positive for pain in his legs with walking. GI:  Some constipation. NEUROLOGIC:  Headache. MUSCULOSKELETAL:  Arthritis joint pain, muscle pain. Review of systems otherwise negative.  PHYSICAL EXAMINATION:  A well-developed and well-nourished white male appearing stated age in no acute distress.  Blood pressure is 135/66, pulse 58, respirations 16.  HEENT:  Normal.  He does have 2+ dorsalis pedis pulses bilaterally.  He does have scattered varicosities over his right medial calf.  He does have a small area over the lateral malleolus of this of a superficial telangiectasia.  There is no eschar present and no evidence of bleeding.  He did undergo noninvasive vascular laboratory studies, which I have reviewed, ordered independently, and discussed with patient.  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  He has no evidence of reflux in the small saphenous vein.  He does have some reflux in his femoral  and popliteal vein on the right.  There is no evidence of DVT.  I have discussed the options with the patient.  I explained that generally, there is a slight chance for rebleeding with this.  If he does have recurrent bleeding, we would suggest sclerotherapy of this area.  If he does not have rebleeding, we would continue observation only.  We did provide him with a 6-inch Ace wrap and explained the importance of elevation and wrapping of this should he have bleeding. He will notify us should he have difficulty in the future.    Larina Earthly, M.D. Electronically Signed  TFE/MEDQ  D:  09/22/2010  T:  09/23/2010  Job:  [id number]  cc:   Caryn Bee L. Little, M.D.

## 2010-10-11 NOTE — Procedures (Unsigned)
LOWER EXTREMITY VENOUS REFLUX EXAM  INDICATION:  Varicose veins with bleeding in the lateral right ankle.  EXAM:  Using color-flow imaging and pulse Doppler spectral analysis, the right common femoral, superficial femoral, popliteal, posterior tibial, greater and lesser saphenous veins are evaluated.  There is evidence suggesting deep venous reflux of  >500 milliseconds noted throughout the right lower extremity.  The right GSV was not adequately visualized.  Patient states a history of vein stripping.  The right proximal small saphenous vein demonstrates competency.  GSV Diameter (used if found to be incompetent only)                                           Right    Left Proximal Greater Saphenous Vein           cm       cm Proximal-to-mid-thigh                     cm       cm Mid thigh                                 cm       cm Mid-distal thigh                          cm       cm Distal thigh                              cm       cm Knee                                      cm       cm  IMPRESSION: 1. The right great saphenous vein was not visualized, as described     above. 2. The deep venous system of the right lower extremity, is not     competent, as described above. 3. The right small saphenous vein is competent.  ___________________________________________ Larina Earthly, M.D.  CH/MEDQ  D:  09/22/2010  T:  09/22/2010  Job:  [id number]

## 2010-11-10 ENCOUNTER — Encounter: Payer: Self-pay | Admitting: Vascular Surgery

## 2012-02-11 ENCOUNTER — Emergency Department (HOSPITAL_COMMUNITY): Payer: Medicare Other

## 2012-02-11 ENCOUNTER — Encounter (HOSPITAL_COMMUNITY): Payer: Self-pay | Admitting: *Deleted

## 2012-02-11 ENCOUNTER — Emergency Department (HOSPITAL_COMMUNITY)
Admission: EM | Admit: 2012-02-11 | Discharge: 2012-02-11 | Disposition: A | Discharge status: Home / Self Care | Payer: Medicare Other | Attending: Emergency Medicine | Admitting: Emergency Medicine

## 2012-02-11 DIAGNOSIS — N189 Chronic kidney disease, unspecified: Secondary | ICD-10-CM | POA: Insufficient documentation

## 2012-02-11 DIAGNOSIS — Z79899 Other long term (current) drug therapy: Secondary | ICD-10-CM | POA: Insufficient documentation

## 2012-02-11 DIAGNOSIS — M129 Arthropathy, unspecified: Secondary | ICD-10-CM | POA: Insufficient documentation

## 2012-02-11 DIAGNOSIS — K219 Gastro-esophageal reflux disease without esophagitis: Secondary | ICD-10-CM | POA: Insufficient documentation

## 2012-02-11 DIAGNOSIS — Z9089 Acquired absence of other organs: Secondary | ICD-10-CM | POA: Insufficient documentation

## 2012-02-11 DIAGNOSIS — Z7982 Long term (current) use of aspirin: Secondary | ICD-10-CM | POA: Insufficient documentation

## 2012-02-11 DIAGNOSIS — Z87891 Personal history of nicotine dependence: Secondary | ICD-10-CM | POA: Insufficient documentation

## 2012-02-11 DIAGNOSIS — R112 Nausea with vomiting, unspecified: Secondary | ICD-10-CM

## 2012-02-11 DIAGNOSIS — E7800 Pure hypercholesterolemia, unspecified: Secondary | ICD-10-CM | POA: Insufficient documentation

## 2012-02-11 DIAGNOSIS — I129 Hypertensive chronic kidney disease with stage 1 through stage 4 chronic kidney disease, or unspecified chronic kidney disease: Secondary | ICD-10-CM | POA: Insufficient documentation

## 2012-02-11 DIAGNOSIS — Z8679 Personal history of other diseases of the circulatory system: Secondary | ICD-10-CM | POA: Insufficient documentation

## 2012-02-11 LAB — CBC WITH DIFFERENTIAL/PLATELET
Hemoglobin: 13.9 g/dL (ref 13.0–17.0)
Lymphs Abs: 1.5 10*3/uL (ref 0.7–4.0)
MCH: 30.8 pg (ref 26.0–34.0)
Monocytes Relative: 6 % (ref 3–12)
Neutro Abs: 5.4 10*3/uL (ref 1.7–7.7)
Neutrophils Relative %: 72 % (ref 43–77)
RBC: 4.51 MIL/uL (ref 4.22–5.81)

## 2012-02-11 LAB — COMPREHENSIVE METABOLIC PANEL
Alkaline Phosphatase: 45 U/L (ref 39–117)
BUN: 28 mg/dL — ABNORMAL HIGH (ref 6–23)
CO2: 27 mEq/L (ref 19–32)
Chloride: 101 mEq/L (ref 96–112)
GFR calc Af Amer: 47 mL/min — ABNORMAL LOW (ref >90)
Glucose, Bld: 106 mg/dL — ABNORMAL HIGH (ref 70–99)
Potassium: 4.6 mEq/L (ref 3.5–5.1)
Total Bilirubin: 0.6 mg/dL (ref 0.3–1.2)

## 2012-02-11 LAB — POCT I-STAT TROPONIN I

## 2012-02-11 MED ORDER — ONDANSETRON HCL 4 MG/2ML IJ SOLN
4.0000 mg | Freq: Once | INTRAMUSCULAR | Status: AC
  Administered 2012-02-11: 4 mg via INTRAVENOUS
  Filled 2012-02-11: qty 2

## 2012-02-11 MED ORDER — SODIUM CHLORIDE 0.9 % IV BOLUS (SEPSIS)
500.0000 mL | Freq: Once | INTRAVENOUS | Status: AC
  Administered 2012-02-11: 500 mL via INTRAVENOUS

## 2012-02-11 MED ORDER — ONDANSETRON HCL 4 MG PO TABS: 4.0000 mg | ORAL_TABLET | Freq: Four times a day (QID) | ORAL | Status: DC

## 2012-02-11 NOTE — ED Notes (Signed)
Pt c/o of vomiting this am, started taking tramadol Friday morning for pain

## 2012-02-11 NOTE — ED Provider Notes (Addendum)
History     CSN: [id number]  Arrival date & time 02/11/12  1314   First MD Initiated Contact with Patient 02/11/12 1329      Chief Complaint  Patient presents with  . Emesis    (Consider location/radiation/quality/duration/timing/severity/associated sxs/prior treatment) HPI Comments: Patient presents with nausea and vomiting that started today. He states he was a little queasiness today but started having vomiting today after breakfast. He had been old prescription for tramadol and was complaining of some joint pain in his knee and his elbow and took 3 yesterday and 1 this morning and he feels like this might of contributed to his nausea. He denies any diarrhea. He denies any chest pain or tightness. He denies any shortness of breath. He denies any abdominal pain. Denies any headache or dizziness. Denies any fevers or cold symptoms. Denies any urinary symptoms. He was recently told that he had some kidney disease on recent blood work and was referred to see a nephrologist but has not yet been. His primary care physician is with [facility].  Patient is a 78 y.o. male presenting with vomiting.  Emesis  Associated symptoms include arthralgias. Pertinent negatives include no abdominal pain, no chills, no cough, no diarrhea, no fever and no headaches.    Past Medical History  Diagnosis Date  . Chronic kidney disease   . Hypertension   . GERD (gastroesophageal reflux disease)   . Arthritis   . Hernia   . Hyperlipidemia   . Hypercholesterolemia   . Varicose veins of lower extremities with complications 3 weeks ago bleeding episode from  vein  right ankle    Past Surgical History  Procedure Date  . Appendectomy   . Foot surgery   . Hemorroidectomy   . Cholecystectomy   . Vein ligation and stripping right greater saphenous vein  about 10 years ago    No family history on file.  History  Substance Use Topics  . Smoking status: Former Smoker    Quit date: 02/21/1974  .  Smokeless tobacco: Not on file  . Alcohol Use: Yes     Comment: occassional  2 beers per week      Review of Systems  Constitutional: Negative for fever, chills, diaphoresis and fatigue.  HENT: Negative for congestion, rhinorrhea and sneezing.   Eyes: Negative.   Respiratory: Negative for cough, chest tightness and shortness of breath.   Cardiovascular: Negative for chest pain and leg swelling.  Gastrointestinal: Positive for nausea and vomiting. Negative for abdominal pain, diarrhea and blood in stool.  Genitourinary: Negative for frequency, hematuria, flank pain and difficulty urinating.  Musculoskeletal: Positive for arthralgias. Negative for back pain.  Skin: Negative for rash.  Neurological: Negative for dizziness, speech difficulty, weakness, numbness and headaches.    Allergies  Morphine and related and Niacin and related  Home Medications   Current Outpatient Rx  Name  Route  Sig  Dispense  Refill  . ASPIRIN 81 MG PO CHEW   Oral   Chew 81 mg by mouth every evening.          . ATENOLOL 25 MG PO TABS   Oral   Take 25 mg by mouth daily. Take 1/2 tablet daily.          . CHOLINE FENOFIBRATE 135 MG PO CPDR   Oral   Take 135 mg by mouth every morning.          Marland Kitchen DILTIAZEM HCL ER COATED BEADS 240 MG PO CP24  Oral   Take 240 mg by mouth every morning.          Marland Kitchen HYDROCHLOROTHIAZIDE 25 MG PO TABS   Oral   Take 25 mg by mouth daily. Take 1/2 tablet once daily.          Marland Kitchen MECLIZINE HCL 25 MG PO TABS   Oral   Take 25 mg by mouth 3 (three) times daily as needed. Inner ear problems         . OLMESARTAN MEDOXOMIL 40 MG PO TABS   Oral   Take 40 mg by mouth daily.           . OMEGA-3-ACID ETHYL ESTERS 1 G PO CAPS   Oral   Take 3 g by mouth 2 (two) times daily.           Marland Kitchen OMEPRAZOLE 20 MG PO CPDR   Oral   Take 20 mg by mouth daily as needed.          Marland Kitchen POLYETHYLENE GLYCOL 3350 PO PACK   Oral   Take 17 g by mouth daily. 1 packet mixed with 8  oz. Water once daily.          Marland Kitchen PRAVASTATIN SODIUM 80 MG PO TABS   Oral   Take 80 mg by mouth every morning.          Marland Kitchen TRAMADOL HCL 50 MG PO TABS   Oral   Take 50 mg by mouth 3 (three) times daily.           Marland Kitchen ONDANSETRON HCL 4 MG PO TABS   Oral   Take 1 tablet (4 mg total) by mouth every 6 (six) hours.   12 tablet   0     BP 147/65  Pulse 56  Temp 98.6 F (37 C) (Oral)  Resp 17  Ht 5\' 9"  (1.753 m)  Wt 180 lb (81.647 kg)  BMI 26.58 kg/m2  SpO2 94%  Physical Exam  Constitutional: He is oriented to person, place, and time. He appears well-developed and well-nourished.  HENT:  Head: Normocephalic and atraumatic.  Mouth/Throat: Oropharynx is clear and moist.  Eyes: Pupils are equal, round, and reactive to light.       No nystagmus  Neck: Normal range of motion. Neck supple.  Cardiovascular: Normal rate, regular rhythm and normal heart sounds.   Pulmonary/Chest: Effort normal and breath sounds normal. No respiratory distress. He has no wheezes. He has no rales. He exhibits no tenderness.  Abdominal: Soft. Bowel sounds are normal. There is no tenderness. There is no rebound and no guarding.  Musculoskeletal: Normal range of motion. He exhibits no edema.  Lymphadenopathy:    He has no cervical adenopathy.  Neurological: He is alert and oriented to person, place, and time. He has normal strength. No cranial nerve deficit or sensory deficit. GCS eye subscore is 4. GCS verbal subscore is 5. GCS motor subscore is 6.  Skin: Skin is warm and dry. No rash noted.  Psychiatric: He has a normal mood and affect.    ED Course  Procedures (including critical care time)  Results for orders placed during the hospital encounter of 02/11/12  CBC WITH DIFFERENTIAL      Component Value Range   WBC 7.6  4.0 - 10.5 K/uL   RBC 4.51  4.22 - 5.81 MIL/uL   Hemoglobin 13.9  13.0 - 17.0 g/dL   HCT 16.1  09.6 - 04.5 %   MCV 88.5  78.0 -  100.0 fL   MCH 30.8  26.0 - 34.0 pg   MCHC 34.8   30.0 - 36.0 g/dL   RDW 16.1  09.6 - 04.5 %   Platelets 150  150 - 400 K/uL   Neutrophils Relative 72  43 - 77 %   Neutro Abs 5.4  1.7 - 7.7 K/uL   Lymphocytes Relative 20  12 - 46 %   Lymphs Abs 1.5  0.7 - 4.0 K/uL   Monocytes Relative 6  3 - 12 %   Monocytes Absolute 0.5  0.1 - 1.0 K/uL   Eosinophils Relative 2  0 - 5 %   Eosinophils Absolute 0.1  0.0 - 0.7 K/uL   Basophils Relative 1  0 - 1 %   Basophils Absolute 0.0  0.0 - 0.1 K/uL  COMPREHENSIVE METABOLIC PANEL      Component Value Range   Sodium 138  135 - 145 mEq/L   Potassium 4.6  3.5 - 5.1 mEq/L   Chloride 101  96 - 112 mEq/L   CO2 27  19 - 32 mEq/L   Glucose, Bld 106 (*) 70 - 99 mg/dL   BUN 28 (*) 6 - 23 mg/dL   Creatinine, Ser 4.09 (*) 0.50 - 1.35 mg/dL   Calcium 9.5  8.4 - 81.1 mg/dL   Total Protein 7.3  6.0 - 8.3 g/dL   Albumin 3.5  3.5 - 5.2 g/dL   AST 27  0 - 37 U/L   ALT 17  0 - 53 U/L   Alkaline Phosphatase 45  39 - 117 U/L   Total Bilirubin 0.6  0.3 - 1.2 mg/dL   GFR calc non Af Amer 40 (*) >90 mL/min   GFR calc Af Amer 47 (*) >90 mL/min  POCT I-STAT TROPONIN I      Component Value Range   Troponin i, poc 0.00  0.00 - 0.08 ng/mL   Comment 3            Dg Chest 2 View  02/11/2012  *RADIOLOGY REPORT*  Clinical Data: Weakness, vomiting  CHEST - 2 VIEW  Comparison: 11/15/2009  Findings: Cardiomediastinal silhouette is stable.  Stable hyperinflation.  No acute infiltrate or pulmonary edema.  Bony thorax is stable  IMPRESSION: .  No active disease.  Hyperinflation again noted.   Original Report Authenticated By: Natasha Mead, M.D.      Dg Chest 2 View  02/11/2012  *RADIOLOGY REPORT*  Clinical Data: Weakness, vomiting  CHEST - 2 VIEW  Comparison: 11/15/2009  Findings: Cardiomediastinal silhouette is stable.  Stable hyperinflation.  No acute infiltrate or pulmonary edema.  Bony thorax is stable  IMPRESSION: .  No active disease.  Hyperinflation again noted.   Original Report Authenticated By: Natasha Mead, M.D.      Date: 02/11/2012  Rate: 56  Rhythm: normal sinus rhythm  QRS Axis: normal  Intervals: normal  ST/T Wave abnormalities: normal  Conduction Disutrbances:none  Narrative Interpretation:   Old EKG Reviewed: unchanged    1. Nausea & vomiting       MDM  Patient is given some IV fluids and Zofran. He is currently feeling much better and wants to go home. He has no chest pain or other symptoms consistent with acute coronary syndrome. He has no ischemic changes on EKG. He is no abdominal pain to suggest an other abdominal etiology. He has no vertiginous-type symptoms. His vomiting could be related to newly starting the Ultram. I did give him a prescription for Zofran  he is at home and advised him to return if his symptoms worsen otherwise followup with his primary care physician on Monday if his symptoms are not any better. His creatinine is mildly elevated but he's very been told he needs to followup with the nephrologist.        Rolan Bucco, MD 02/11/12 1524  Rolan Bucco, MD 02/11/12 1525

## 2012-02-11 NOTE — ED Notes (Signed)
RN to obtain labs with start of IV 

## 2012-07-12 ENCOUNTER — Other Ambulatory Visit: Payer: Self-pay | Admitting: Family Medicine

## 2012-07-12 ENCOUNTER — Ambulatory Visit
Admission: RE | Admit: 2012-07-12 | Discharge: 2012-07-12 | Disposition: A | Discharge status: Home / Self Care | Payer: Medicare Other | Source: Ambulatory Visit | Attending: Family Medicine | Admitting: Family Medicine

## 2012-07-12 DIAGNOSIS — R109 Unspecified abdominal pain: Secondary | ICD-10-CM

## 2012-07-12 MED ORDER — IOHEXOL 300 MG/ML  SOLN: 30.0000 mL | Freq: Once | INTRAMUSCULAR | Status: AC | PRN

## 2012-07-29 ENCOUNTER — Emergency Department (HOSPITAL_COMMUNITY)
Admission: EM | Admit: 2012-07-29 | Discharge: 2012-07-29 | Disposition: A | Discharge status: Home / Self Care | Payer: Medicare Other | Attending: Emergency Medicine | Admitting: Emergency Medicine

## 2012-07-29 ENCOUNTER — Encounter (HOSPITAL_COMMUNITY): Payer: Self-pay | Admitting: Emergency Medicine

## 2012-07-29 DIAGNOSIS — Z79899 Other long term (current) drug therapy: Secondary | ICD-10-CM | POA: Insufficient documentation

## 2012-07-29 DIAGNOSIS — I129 Hypertensive chronic kidney disease with stage 1 through stage 4 chronic kidney disease, or unspecified chronic kidney disease: Secondary | ICD-10-CM | POA: Insufficient documentation

## 2012-07-29 DIAGNOSIS — R42 Dizziness and giddiness: Secondary | ICD-10-CM | POA: Insufficient documentation

## 2012-07-29 DIAGNOSIS — N189 Chronic kidney disease, unspecified: Secondary | ICD-10-CM | POA: Insufficient documentation

## 2012-07-29 DIAGNOSIS — E7800 Pure hypercholesterolemia, unspecified: Secondary | ICD-10-CM | POA: Insufficient documentation

## 2012-07-29 DIAGNOSIS — Z7982 Long term (current) use of aspirin: Secondary | ICD-10-CM | POA: Insufficient documentation

## 2012-07-29 DIAGNOSIS — M779 Enthesopathy, unspecified: Secondary | ICD-10-CM | POA: Insufficient documentation

## 2012-07-29 DIAGNOSIS — Z8719 Personal history of other diseases of the digestive system: Secondary | ICD-10-CM | POA: Insufficient documentation

## 2012-07-29 DIAGNOSIS — Z87891 Personal history of nicotine dependence: Secondary | ICD-10-CM | POA: Insufficient documentation

## 2012-07-29 DIAGNOSIS — Z8679 Personal history of other diseases of the circulatory system: Secondary | ICD-10-CM | POA: Insufficient documentation

## 2012-07-29 DIAGNOSIS — K219 Gastro-esophageal reflux disease without esophagitis: Secondary | ICD-10-CM | POA: Insufficient documentation

## 2012-07-29 DIAGNOSIS — R110 Nausea: Secondary | ICD-10-CM | POA: Insufficient documentation

## 2012-07-29 DIAGNOSIS — E785 Hyperlipidemia, unspecified: Secondary | ICD-10-CM | POA: Insufficient documentation

## 2012-07-29 LAB — POCT I-STAT, CHEM 8
BUN: 28 mg/dL — ABNORMAL HIGH (ref 6–23)
Creatinine, Ser: 1.5 mg/dL — ABNORMAL HIGH (ref 0.50–1.35)
Hemoglobin: 13.6 g/dL (ref 13.0–17.0)
Potassium: 4.3 mEq/L (ref 3.5–5.1)
Sodium: 138 mEq/L (ref 135–145)

## 2012-07-29 NOTE — ED Provider Notes (Signed)
History     CSN: [id number]  Arrival date & time 07/29/12  1659   First MD Initiated Contact with Patient 07/29/12 1702      Chief Complaint  Patient presents with  . Dizziness    (Consider location/radiation/quality/duration/timing/severity/associated sxs/prior treatment) HPI Pt states that he has a hx of vertigo and that he had some nausea with dizzyness today took a meclazine and feels better now.   Sudden onset.. Lasts a few seconds to minutes.  No headache.  No weakness or speech problems.  Has happened before.   Past Medical History  Diagnosis Date  . Chronic kidney disease   . Hypertension   . GERD (gastroesophageal reflux disease)   . Arthritis   . Hernia   . Hyperlipidemia   . Hypercholesterolemia   . Varicose veins of lower extremities with complications 3 weeks ago bleeding episode from  vein  right ankle    Past Surgical History  Procedure Laterality Date  . Appendectomy    . Foot surgery    . Hemorroidectomy    . Cholecystectomy    . Vein ligation and stripping  right greater saphenous vein  about 10 years ago    No family history on file.  History  Substance Use Topics  . Smoking status: Former Smoker    Quit date: 02/21/1974  . Smokeless tobacco: Not on file  . Alcohol Use: Yes     Comment: occassional  2 beers per week      Review of Systems  All other systems reviewed and are negative.    Allergies  Morphine and related and Niacin and related  Home Medications   Current Outpatient Rx  Name  Route  Sig  Dispense  Refill  . aspirin 81 MG chewable tablet   Oral   Chew 81 mg by mouth every evening.          . fenofibrate 160 MG tablet   Oral   Take 160 mg by mouth daily.         Marland Kitchen lidocaine (LIDODERM) 5 %   Transdermal   Place 1 patch onto the skin daily. Remove & Discard patch within 12 hours or as directed by MD         . lisinopril (PRINIVIL,ZESTRIL) 40 MG tablet   Oral   Take 40 mg by mouth daily.         .  traMADol (ULTRAM) 50 MG tablet   Oral   Take 50 mg by mouth every 8 (eight) hours as needed for pain.          Marland Kitchen atenolol (TENORMIN) 25 MG tablet   Oral   Take 12.5 mg by mouth daily.          Marland Kitchen diltiazem (CARDIZEM CD) 240 MG 24 hr capsule   Oral   Take 240 mg by mouth every morning.          . hydrochlorothiazide 25 MG tablet   Oral   Take 25 mg by mouth daily. Take 1/2 tablet once daily.          . meclizine (ANTIVERT) 25 MG tablet   Oral   Take 25 mg by mouth 3 (three) times daily as needed. Inner ear problems         . olmesartan (BENICAR) 40 MG tablet   Oral   Take 40 mg by mouth daily.           Marland Kitchen omega-3 acid ethyl esters (LOVAZA) 1 G  capsule   Oral   Take 3 g by mouth 2 (two) times daily.           Marland Kitchen omeprazole (PRILOSEC) 20 MG capsule   Oral   Take 20 mg by mouth daily as needed.          . polyethylene glycol (MIRALAX) packet   Oral   Take 17 g by mouth daily. 1 packet mixed with 8 oz. Water once daily.          . pravastatin (PRAVACHOL) 80 MG tablet   Oral   Take 80 mg by mouth every morning.            BP 131/67  Pulse 62  Temp(Src) 98.2 F (36.8 C) (Oral)  Resp 22  SpO2 96%  Physical Exam Physical Exam  Nursing note and vitals reviewed. Constitutional: He is oriented to person, place, and time. He appears well-developed and well-nourished. No distress.  HENT:  Head: Normocephalic and atraumatic.  Eyes: Pupils are equal, round, and reactive to light.  Neck: Normal range of motion.  Cardiovascular: Normal rate and intact distal pulses.   Pulmonary/Chest: No respiratory distress.  Abdominal: Normal appearance. He exhibits no distension.  Musculoskeletal: Normal range of motion.  Neurological: He is alert and oriented to person, place, and time. No cranial nerve deficit. GCS eye subscore is 4. GCS verbal subscore is 5. GCS motor subscore is 6.  HINTS is neg  Skin: Skin is warm and dry. No rash noted.  Psychiatric: He has a  normal mood and affect. His behavior is normal.   ED Course  Procedures (including critical care time)  Medications - No data to display  Labs Reviewed  POCT I-STAT, CHEM 8 - Abnormal; Notable for the following:    BUN 28 (*)    Creatinine, Ser 1.50 (*)    Glucose, Bld 120 (*)    All other components within normal limits   No results found.   1. Vertigo       MDM  After treatment in the ED the patient feels back to baseline and wants to go home.        Nelia Shi, MD 07/30/12 2240

## 2012-07-29 NOTE — Discharge Instructions (Signed)
Dizziness Dizziness is a common problem. It is a feeling of unsteadiness or lightheadedness. You may feel like you are about to faint. Dizziness can lead to injury if you stumble or fall. A person of any age group can suffer from dizziness, but dizziness is more common in older adults. CAUSES  Dizziness can be caused by many different things, including:  Middle ear problems.  Standing for too long.  Infections.  An allergic reaction.  Aging.  An emotional response to something, such as the sight of blood.  Side effects of medicines.  Fatigue.  Problems with circulation or blood pressure.  Excess use of alcohol, medicines, or illegal drug use.  Breathing too fast (hyperventilation).  An arrhythmia or problems with your heart rhythm.  Low red blood cell count (anemia).  Pregnancy.  Vomiting, diarrhea, fever, or other illnesses that cause dehydration.  Diseases or conditions such as Parkinson's disease, high blood pressure (hypertension), diabetes, and thyroid problems.  Exposure to extreme heat. DIAGNOSIS  To find the cause of your dizziness, your caregiver may do a physical exam, lab tests, radiologic imaging scans, or an electrocardiography test (ECG).  TREATMENT  Treatment of dizziness depends on the cause of your symptoms and can vary greatly. HOME CARE INSTRUCTIONS   Drink enough fluids to keep your urine clear or pale yellow. This is especially important in very hot weather. In the elderly, it is also important in cold weather.  If your dizziness is caused by medicines, take them exactly as directed. When taking blood pressure medicines, it is especially important to get up slowly.  Rise slowly from chairs and steady yourself until you feel okay.  In the morning, first sit up on the side of the bed. When this seems okay, stand slowly while holding onto something until you know your balance is fine.  If you need to stand in one place for a long time, be sure to  move your legs often. Tighten and relax the muscles in your legs while standing.  If dizziness continues to be a problem, have someone stay with you for a day or two. Do this until you feel you are well enough to stay alone. Have the person call your caregiver if he or she notices changes in you that are concerning.  Do not drive or use heavy machinery if you feel dizzy.  Do not drink alcohol. SEEK IMMEDIATE MEDICAL CARE IF:   Your dizziness or lightheadedness gets worse.  You feel nauseous or vomit.  You develop problems with talking, walking, weakness, or using your arms, hands, or legs.  You are not thinking clearly or you have difficulty forming sentences. It may take a friend or family member to determine if your thinking is normal.  You develop chest pain, abdominal pain, shortness of breath, or sweating.  Your vision changes.  You notice any bleeding.  You have side effects from medicine that seems to be getting worse rather than better. MAKE SURE YOU:   Understand these instructions.  Will watch your condition.  Will get help right away if you are not doing well or get worse. Document Released: 08/03/2000 Document Revised: 05/02/2011 Document Reviewed: 08/27/2010 [facility] Patient Information 2014 [place], [place].  Vertigo Vertigo means you feel like you are moving when you are not. Vertigo can make you feel like things around you are moving when they are not. This problem often goes away on its own.  HOME CARE   Follow your doctor's instructions.  Avoid driving.  Avoid using heavy machinery.  Avoid doing any activity that could be dangerous if you have a vertigo attack.  Tell your doctor if a medicine seems to cause your vertigo. GET HELP RIGHT AWAY IF:   Your medicines do not help or make you feel worse.  You have trouble talking or walking.  You feel weak or have trouble using your arms, hands, or legs.  You have bad headaches.  You keep feeling sick  to your stomach (nauseous) or throwing up (vomiting).  Your vision changes.  A family member notices changes in your behavior.  Your problems get worse. MAKE SURE YOU:  Understand these instructions.  Will watch your condition.  Will get help right away if you are not doing well or get worse. Document Released: 11/17/2007 Document Revised: 05/02/2011 Document Reviewed: 08/26/2010 [facility] Patient Information 2014 [place], [place].

## 2012-07-29 NOTE — ED Notes (Signed)
Pt states that he has a hx of vertigo and that he had some nausea with dizzyness today took a meclazine and feels better now.

## 2013-02-03 ENCOUNTER — Emergency Department (HOSPITAL_COMMUNITY): Payer: Medicare Other

## 2013-02-03 ENCOUNTER — Encounter (HOSPITAL_COMMUNITY): Payer: Self-pay | Admitting: Emergency Medicine

## 2013-02-03 ENCOUNTER — Emergency Department (HOSPITAL_COMMUNITY)
Admission: EM | Admit: 2013-02-03 | Discharge: 2013-02-03 | Disposition: A | Discharge status: Home / Self Care | Payer: Medicare Other | Attending: Emergency Medicine | Admitting: Emergency Medicine

## 2013-02-03 DIAGNOSIS — R062 Wheezing: Secondary | ICD-10-CM | POA: Insufficient documentation

## 2013-02-03 DIAGNOSIS — E785 Hyperlipidemia, unspecified: Secondary | ICD-10-CM | POA: Insufficient documentation

## 2013-02-03 DIAGNOSIS — Z7982 Long term (current) use of aspirin: Secondary | ICD-10-CM | POA: Insufficient documentation

## 2013-02-03 DIAGNOSIS — Z87891 Personal history of nicotine dependence: Secondary | ICD-10-CM | POA: Insufficient documentation

## 2013-02-03 DIAGNOSIS — R1110 Vomiting, unspecified: Secondary | ICD-10-CM | POA: Insufficient documentation

## 2013-02-03 DIAGNOSIS — R630 Anorexia: Secondary | ICD-10-CM | POA: Insufficient documentation

## 2013-02-03 DIAGNOSIS — M129 Arthropathy, unspecified: Secondary | ICD-10-CM | POA: Insufficient documentation

## 2013-02-03 DIAGNOSIS — I129 Hypertensive chronic kidney disease with stage 1 through stage 4 chronic kidney disease, or unspecified chronic kidney disease: Secondary | ICD-10-CM | POA: Insufficient documentation

## 2013-02-03 DIAGNOSIS — K219 Gastro-esophageal reflux disease without esophagitis: Secondary | ICD-10-CM | POA: Insufficient documentation

## 2013-02-03 DIAGNOSIS — J111 Influenza due to unidentified influenza virus with other respiratory manifestations: Secondary | ICD-10-CM | POA: Insufficient documentation

## 2013-02-03 DIAGNOSIS — N189 Chronic kidney disease, unspecified: Secondary | ICD-10-CM | POA: Insufficient documentation

## 2013-02-03 DIAGNOSIS — E7800 Pure hypercholesterolemia, unspecified: Secondary | ICD-10-CM | POA: Insufficient documentation

## 2013-02-03 DIAGNOSIS — R109 Unspecified abdominal pain: Secondary | ICD-10-CM | POA: Insufficient documentation

## 2013-02-03 DIAGNOSIS — Z79899 Other long term (current) drug therapy: Secondary | ICD-10-CM | POA: Insufficient documentation

## 2013-02-03 DIAGNOSIS — J3489 Other specified disorders of nose and nasal sinuses: Secondary | ICD-10-CM | POA: Insufficient documentation

## 2013-02-03 LAB — CBC WITH DIFFERENTIAL/PLATELET
Basophils Relative: 1 % (ref 0–1)
Eosinophils Relative: 2 % (ref 0–5)
Hemoglobin: 13.3 g/dL (ref 13.0–17.0)
MCH: 30.9 pg (ref 26.0–34.0)
MCV: 90.7 fL (ref 78.0–100.0)
Monocytes Absolute: 0.5 10*3/uL (ref 0.1–1.0)
Monocytes Relative: 12 % (ref 3–12)
Neutrophils Relative %: 51 % (ref 43–77)
RBC: 4.31 MIL/uL (ref 4.22–5.81)
WBC: 4.4 10*3/uL (ref 4.0–10.5)

## 2013-02-03 LAB — POCT I-STAT, CHEM 8
Creatinine, Ser: 1.8 mg/dL — ABNORMAL HIGH (ref 0.50–1.35)
HCT: 42 % (ref 39.0–52.0)
Hemoglobin: 14.3 g/dL (ref 13.0–17.0)
Potassium: 4 mEq/L (ref 3.5–5.1)
Sodium: 140 mEq/L (ref 135–145)
TCO2: 25 mmol/L (ref 0–100)

## 2013-02-03 MED ORDER — BENZONATATE 100 MG PO CAPS: 100.0000 mg | ORAL_CAPSULE | Freq: Three times a day (TID) | ORAL | Status: DC

## 2013-02-03 MED ORDER — GUAIFENESIN 100 MG/5ML PO LIQD: 100.0000 mg | ORAL | Status: AC | PRN

## 2013-02-03 MED ORDER — IPRATROPIUM BROMIDE 0.02 % IN SOLN
0.5000 mg | Freq: Once | RESPIRATORY_TRACT | Status: AC
  Administered 2013-02-03: 0.5 mg via RESPIRATORY_TRACT
  Filled 2013-02-03: qty 2.5

## 2013-02-03 MED ORDER — HYDROCODONE-HOMATROPINE 5-1.5 MG/5ML PO SYRP
5.0000 mL | ORAL_SOLUTION | Freq: Once | ORAL | Status: AC
  Administered 2013-02-03: 5 mL via ORAL
  Filled 2013-02-03: qty 5

## 2013-02-03 MED ORDER — ALBUTEROL SULFATE (5 MG/ML) 0.5% IN NEBU
5.0000 mg | INHALATION_SOLUTION | Freq: Once | RESPIRATORY_TRACT | Status: AC
  Administered 2013-02-03: 5 mg via RESPIRATORY_TRACT
  Filled 2013-02-03: qty 1

## 2013-02-03 MED ORDER — OSELTAMIVIR PHOSPHATE 75 MG PO CAPS
75.0000 mg | ORAL_CAPSULE | Freq: Every day | ORAL | Status: DC
  Administered 2013-02-03: 75 mg via ORAL
  Filled 2013-02-03: qty 1

## 2013-02-03 MED ORDER — ALBUTEROL SULFATE HFA 108 (90 BASE) MCG/ACT IN AERS: 2.0000 | INHALATION_SPRAY | RESPIRATORY_TRACT | Status: DC | PRN

## 2013-02-03 MED ORDER — OSELTAMIVIR PHOSPHATE 75 MG PO CAPS: 75.0000 mg | ORAL_CAPSULE | Freq: Two times a day (BID) | ORAL | Status: DC

## 2013-02-03 NOTE — ED Notes (Signed)
Pt c/o cough, congestion and mid abd pain when he coughs that started on Friday.  Pt states the phlegm is white in color.

## 2013-02-03 NOTE — ED Provider Notes (Signed)
  Face-to-face evaluation   History: He complains of cough with congestion and abdominal pain, when he coughs for several. His sputum is white in color. He denies nausea or vomiting. He did not have immunization for influenza, this year.  Physical exam: Exam alert, elderly, frail man. Lungs have decreased air movement bilaterally with scattered expiratory wheezes. There is no increased work of breathing.  Medical screening examination/treatment/procedure(s) were conducted as a shared visit with non-physician practitioner(s) and myself.  I personally evaluated the patient during the encounter  Flint Melter, MD 02/03/13 2324

## 2013-02-03 NOTE — ED Provider Notes (Signed)
CSN: [id number]     Arrival date & time 02/03/13  1708 History  This chart was scribed for non-physician practitioner, Fayrene Helper, PA-C,working with Flint Melter, MD, by Karle Plumber, ED Scribe.  This patient was seen in room WTR6/WTR6 and the patient's care was started at 5:34 PM.  Chief Complaint  Patient presents with  . Cough  . Abdominal Pain  . Nasal Congestion   The history is provided by the patient. No language interpreter was used.   HPI Comments:  Ronald [name] is a 79 y.o. male who presents to the Emergency Department complaining of coughing for approximately two days. He reports associated sneezing, nasal congestion, and loss of appetite. He reports one episode of emesis about 30 minutes after taking a cough syrup. He states he has taken an antihistamine medication with no relief. Pt reports abdominal pain secondary to cough. He denies fever, nausea, chills, diarrhea, rash, SOB, or dysuria. He denies having a flu vaccination this year. He reports having a pneumonia vaccination last year. He states his PCP is Dr. Lynnell Jude.   Past Medical History  Diagnosis Date  . Chronic kidney disease   . Hypertension   . GERD (gastroesophageal reflux disease)   . Arthritis   . Hernia   . Hyperlipidemia   . Hypercholesterolemia   . Varicose veins of lower extremities with complications 3 weeks ago bleeding episode from  vein  right ankle   Past Surgical History  Procedure Laterality Date  . Appendectomy    . Foot surgery    . Hemorroidectomy    . Cholecystectomy    . Vein ligation and stripping  right greater saphenous vein  about 10 years ago   No family history on file. History  Substance Use Topics  . Smoking status: Former Smoker    Quit date: 02/21/1974  . Smokeless tobacco: Not on file  . Alcohol Use: Yes     Comment: occassional  2 beers per week    Review of Systems  Constitutional: Negative for fever, chills and appetite change.  HENT: Positive for  congestion.   Respiratory: Positive for cough. Negative for shortness of breath.   Cardiovascular: Negative for chest pain.  Gastrointestinal: Positive for vomiting and abdominal pain (secondary to cough). Negative for nausea and diarrhea.  Genitourinary: Negative for dysuria and difficulty urinating.  Skin: Negative for rash.    Allergies  Morphine and related and Niacin and related  Home Medications   Current Outpatient Rx  Name  Route  Sig  Dispense  Refill  . aspirin 81 MG chewable tablet   Oral   Chew 81 mg by mouth every evening.          Marland Kitchen atenolol (TENORMIN) 25 MG tablet   Oral   Take 12.5 mg by mouth daily.          Marland Kitchen diltiazem (CARDIZEM CD) 240 MG 24 hr capsule   Oral   Take 240 mg by mouth every morning.          . fenofibrate 160 MG tablet   Oral   Take 160 mg by mouth daily.         . hydrochlorothiazide 25 MG tablet   Oral   Take 25 mg by mouth daily. Take 1/2 tablet once daily.          Marland Kitchen lidocaine (LIDODERM) 5 %   Transdermal   Place 1 patch onto the skin daily. Remove & Discard patch within 12 hours  or as directed by MD         . lisinopril (PRINIVIL,ZESTRIL) 40 MG tablet   Oral   Take 40 mg by mouth daily.         . meclizine (ANTIVERT) 25 MG tablet   Oral   Take 25 mg by mouth 3 (three) times daily as needed. Inner ear problems         . olmesartan (BENICAR) 40 MG tablet   Oral   Take 40 mg by mouth daily.           Marland Kitchen omega-3 acid ethyl esters (LOVAZA) 1 G capsule   Oral   Take 3 g by mouth 2 (two) times daily.           Marland Kitchen omeprazole (PRILOSEC) 20 MG capsule   Oral   Take 20 mg by mouth daily as needed.          . polyethylene glycol (MIRALAX) packet   Oral   Take 17 g by mouth daily. 1 packet mixed with 8 oz. Water once daily.          . pravastatin (PRAVACHOL) 80 MG tablet   Oral   Take 80 mg by mouth every morning.          . traMADol (ULTRAM) 50 MG tablet   Oral   Take 50 mg by mouth every 8 (eight)  hours as needed for pain.           Triage Vitals: BP 164/75  Pulse 73  Temp(Src) 98.5 F (36.9 C) (Oral)  Resp 18  SpO2 95% Physical Exam  Nursing note and vitals reviewed. Constitutional: He is oriented to person, place, and time. He appears well-developed and well-nourished.  HENT:  Head: Normocephalic and atraumatic.  Mouth/Throat: Uvula is midline and oropharynx is clear and moist.  No tonsillar exudate.  Eyes: EOM are normal.  Neck: Normal range of motion.  Cardiovascular: Normal rate.   Pulmonary/Chest: Effort normal. No respiratory distress. He has wheezes (mild expiratory wheezes). He has rhonchi.  Musculoskeletal: Normal range of motion.  Neurological: He is alert and oriented to person, place, and time.  Skin: Skin is warm and dry.  Psychiatric: He has a normal mood and affect. His behavior is normal.    ED Course  Procedures (including critical care time) DIAGNOSTIC STUDIES: Oxygen Saturation is 95% on RA, adequate by my interpretation.   COORDINATION OF CARE: 5:40 PM- Will obtain a CXR. Pt verbalizes understanding and agrees to plan.  Patient has rhonchi and wheezing on exam. He is however in no acute respiratory distress. No prior history of COPD or asthma. Differential diagnosis includes URI, pneumonia, or influenza. Care discussed my attending who has evaluated the patient. Chest x-ray ordered.  7:18 PM Pt with evidence of renal insufficiency likely 2/2 dehydration from lack of appetite.  Pt however able to tolerates po and states he is starting to eat again today.  No hypoxia with ambulation.  I offer option of admission and IVF and further monitoring however pt request to be discharge home.  He only request for medications to help with his sxs.  He agrees to f/u with PCP for further care.  He will drink more fluid and use inhaler as needed.  Cough medication and expectorant provide for sxs treatment.  Return precaution discussed.  Otherwise pt stable for  discharge.  Influenza culture obtain, pt to f/u with pcp for result and will request influenza shot through pcp.  Will also  treat suspect influenza with tamiflu Pt made aware fo have his renal function recheck by his PCP.    Medications - No data to display  Labs Review Labs Reviewed - No data to display Imaging Review Dg Chest 2 View  02/03/2013   CLINICAL DATA:  Cough.  Wheezing.  EXAM: CHEST  2 VIEW  COMPARISON:  02/11/2012.  11/15/2009.  FINDINGS: Unchanged faint opacity at the right lung base, dating back to 2011. This probably represents a focus of scarring. Emphysema is present. The cardiopericardial silhouette is within normal limits. Bilateral pleural apical scarring. There is no airspace disease. No pleural effusion.  IMPRESSION: Emphysema without acute cardiopulmonary disease.   Electronically Signed   By: Andreas Newport M.D.   On: 02/03/2013 18:20    EKG Interpretation   None       MDM   1. Influenza    BP 164/75  Pulse 73  Temp(Src) 98.5 F (36.9 C) (Oral)  Resp 18  SpO2 94%  I have reviewed nursing notes and vital signs. I personally reviewed the imaging tests through PACS system  I reviewed available ER/hospitalization records thought the EMR   I personally performed the services described in this documentation, which was scribed in my presence. The recorded information has been reviewed and is accurate.     Fayrene Helper, PA-C 02/03/13 1958

## 2013-02-04 LAB — INFLUENZA PANEL BY PCR (TYPE A & B)
H1N1 flu by pcr: NOT DETECTED
Influenza A By PCR: POSITIVE — AB
Influenza B By PCR: NEGATIVE

## 2014-01-27 ENCOUNTER — Emergency Department (HOSPITAL_COMMUNITY)
Admission: EM | Admit: 2014-01-27 | Discharge: 2014-01-27 | Disposition: A | Discharge status: Home / Self Care | Payer: Commercial Managed Care - HMO | Attending: Emergency Medicine | Admitting: Emergency Medicine

## 2014-01-27 ENCOUNTER — Encounter (HOSPITAL_COMMUNITY): Payer: Self-pay | Admitting: Emergency Medicine

## 2014-01-27 DIAGNOSIS — K5900 Constipation, unspecified: Secondary | ICD-10-CM | POA: Insufficient documentation

## 2014-01-27 DIAGNOSIS — Z87891 Personal history of nicotine dependence: Secondary | ICD-10-CM | POA: Diagnosis not present

## 2014-01-27 DIAGNOSIS — I129 Hypertensive chronic kidney disease with stage 1 through stage 4 chronic kidney disease, or unspecified chronic kidney disease: Secondary | ICD-10-CM | POA: Diagnosis not present

## 2014-01-27 DIAGNOSIS — K644 Residual hemorrhoidal skin tags: Secondary | ICD-10-CM | POA: Diagnosis not present

## 2014-01-27 DIAGNOSIS — Z79899 Other long term (current) drug therapy: Secondary | ICD-10-CM | POA: Diagnosis not present

## 2014-01-27 DIAGNOSIS — R195 Other fecal abnormalities: Secondary | ICD-10-CM | POA: Insufficient documentation

## 2014-01-27 DIAGNOSIS — E780 Pure hypercholesterolemia: Secondary | ICD-10-CM | POA: Insufficient documentation

## 2014-01-27 DIAGNOSIS — E785 Hyperlipidemia, unspecified: Secondary | ICD-10-CM | POA: Diagnosis not present

## 2014-01-27 DIAGNOSIS — Z9049 Acquired absence of other specified parts of digestive tract: Secondary | ICD-10-CM | POA: Insufficient documentation

## 2014-01-27 DIAGNOSIS — M1990 Unspecified osteoarthritis, unspecified site: Secondary | ICD-10-CM | POA: Diagnosis not present

## 2014-01-27 DIAGNOSIS — Z7982 Long term (current) use of aspirin: Secondary | ICD-10-CM | POA: Diagnosis not present

## 2014-01-27 DIAGNOSIS — N189 Chronic kidney disease, unspecified: Secondary | ICD-10-CM | POA: Diagnosis not present

## 2014-01-27 DIAGNOSIS — K219 Gastro-esophageal reflux disease without esophagitis: Secondary | ICD-10-CM | POA: Insufficient documentation

## 2014-01-27 MED ORDER — POTASSIUM CHLORIDE 10 MEQ/100ML IV SOLN: 10.0000 meq | INTRAVENOUS | Status: DC

## 2014-01-27 MED ORDER — POLYETHYLENE GLYCOL 3350 17 G PO PACK: 17.0000 g | PACK | Freq: Every day | ORAL | Status: AC

## 2014-01-27 NOTE — ED Notes (Addendum)
Pt c/o constipation x 2 days with intense rectal pain since 10:45 am this morning. Pt ambulatory to triage. A&Ox4. Pt sts this has never happened to him before. Pt sts that earlier this morning he digitally removed some fecal matter with his own fingers. Pt denies rectal bleeding.

## 2014-01-27 NOTE — Discharge Instructions (Signed)

## 2014-01-27 NOTE — ED Provider Notes (Signed)
CSN: [id number]     Arrival date & time 01/27/14  1121 History   First MD Initiated Contact with Patient 01/27/14 1241     Chief Complaint  Patient presents with  . Constipation  . Rectal Pain     (Consider location/radiation/quality/duration/timing/severity/associated sxs/prior Treatment) HPI Comments: Patient presents with constipation and rectal pain. He states he hasn't been able to have bowel movement in about 2 days with some increased constipation for the last month. He has intense rectal pain since this morning. He's been trying to remove some with his fingers but hasn't been successful. He's having a little bit of bleeding around his bottom. He denies any abdominal pain or vomiting. He denies any fevers or chills. He denies any ongoing history of constipation. He's been using fiber supplements for the last 2 weeks without improvement of symptoms.  Patient is a 80 y.o. male presenting with constipation.  Constipation Associated symptoms: no abdominal pain, no back pain, no diarrhea, no fever, no nausea and no vomiting     Past Medical History  Diagnosis Date  . Chronic kidney disease   . Hypertension   . GERD (gastroesophageal reflux disease)   . Arthritis   . Hernia   . Hyperlipidemia   . Hypercholesterolemia   . Varicose veins of lower extremities with complications 3 weeks ago bleeding episode from  vein  right ankle   Past Surgical History  Procedure Laterality Date  . Appendectomy    . Foot surgery    . Hemorroidectomy    . Cholecystectomy    . Vein ligation and stripping  right greater saphenous vein  about 10 years ago   No family history on file. History  Substance Use Topics  . Smoking status: Former Smoker    Quit date: 02/21/1974  . Smokeless tobacco: Not on file  . Alcohol Use: Yes     Comment: occassional  2 beers per week    Review of Systems  Constitutional: Negative for fever, chills, diaphoresis and fatigue.  HENT: Negative for congestion,  rhinorrhea and sneezing.   Eyes: Negative.   Respiratory: Negative for cough, chest tightness and shortness of breath.   Cardiovascular: Negative for chest pain and leg swelling.  Gastrointestinal: Positive for constipation, blood in stool and rectal pain. Negative for nausea, vomiting, abdominal pain and diarrhea.  Genitourinary: Negative for frequency, hematuria, flank pain and difficulty urinating.  Musculoskeletal: Negative for back pain and arthralgias.  Skin: Negative for rash.  Neurological: Negative for dizziness, speech difficulty, weakness, numbness and headaches.      Allergies  Morphine and related and Niacin and related  Home Medications   Prior to Admission medications   Medication Sig Start Date End Date Taking? Authorizing Provider  acetaminophen (TYLENOL) 325 MG tablet Take 650 mg by mouth every 6 (six) hours as needed.    Historical Provider, MD  aspirin 81 MG chewable tablet Chew 81 mg by mouth every evening.     Historical Provider, MD  atenolol (TENORMIN) 25 MG tablet Take 12.5 mg by mouth daily.     Historical Provider, MD  benzonatate (TESSALON) 100 MG capsule Take 1 capsule (100 mg total) by mouth every 8 (eight) hours. 02/03/13   Domenic Moras, PA-C  chlorpheniramine (CHLOR-TRIMETON) 4 MG tablet Take 4 mg by mouth 2 (two) times daily as needed for allergies.    Historical Provider, MD  diltiazem (CARDIZEM CD) 240 MG 24 hr capsule Take 240 mg by mouth every morning.     Historical  Provider, MD  fenofibrate 160 MG tablet Take 160 mg by mouth daily.    Historical Provider, MD  guaiFENesin (ROBITUSSIN) 100 MG/5ML liquid Take 5-10 mLs (100-200 mg total) by mouth every 4 (four) hours as needed for cough. 02/03/13   Domenic Moras, PA-C  hydrochlorothiazide 25 MG tablet Take 25 mg by mouth daily. Take 1/2 tablet once daily.     Historical Provider, MD  lisinopril (PRINIVIL,ZESTRIL) 40 MG tablet Take 40 mg by mouth daily.    Historical Provider, MD  omega-3 acid ethyl esters  (LOVAZA) 1 G capsule Take 3 g by mouth 2 (two) times daily.      Historical Provider, MD  omeprazole (PRILOSEC) 20 MG capsule Take 20 mg by mouth daily as needed.     Historical Provider, MD  oseltamivir (TAMIFLU) 75 MG capsule Take 1 capsule (75 mg total) by mouth every 12 (twelve) hours. 02/03/13   Domenic Moras, PA-C  polyethylene glycol (MIRALAX / GLYCOLAX) packet Take 17 g by mouth daily. 01/27/14   Malvin Johns, MD  pravastatin (PRAVACHOL) 80 MG tablet Take 80 mg by mouth every morning.     Historical Provider, MD   BP 136/82 mmHg  Pulse 115  Temp(Src) 98.1 F (36.7 C) (Oral)  Resp 18  SpO2 97% Physical Exam  Constitutional: He is oriented to person, place, and time. He appears well-developed and well-nourished.  HENT:  Head: Normocephalic and atraumatic.  Eyes: Pupils are equal, round, and reactive to light.  Neck: Normal range of motion. Neck supple.  Cardiovascular: Normal rate, regular rhythm and normal heart sounds.   Pulmonary/Chest: Effort normal and breath sounds normal. No respiratory distress. He has no wheezes. He has no rales. He exhibits no tenderness.  Abdominal: Soft. Bowel sounds are normal. There is no tenderness. There is no rebound and no guarding.  Small bleeding nonthrombosed external hemorrhoid  Musculoskeletal: Normal range of motion. He exhibits no edema.  Lymphadenopathy:    He has no cervical adenopathy.  Neurological: He is alert and oriented to person, place, and time.  Skin: Skin is warm and dry. No rash noted.  Psychiatric: He has a normal mood and affect.    ED Course  Procedures (including critical care time) Labs Review Labs Reviewed - No data to display  Imaging Review No results found.   EKG Interpretation None      MDM   Final diagnoses:  Constipation, unspecified constipation type    Patient had a fecal impaction that was removed by the nurse prior to my exam. He was feeling 100% better after that. He had a small hemorrhoid that  was bleeding but I don't see any other signs of rectal bleeding. He was discharged home with some Miralax. He was given return precautions.    Malvin Johns, MD 01/27/14 [phone]

## 2014-03-11 DIAGNOSIS — L603 Nail dystrophy: Secondary | ICD-10-CM | POA: Diagnosis not present

## 2014-03-19 DIAGNOSIS — J209 Acute bronchitis, unspecified: Secondary | ICD-10-CM | POA: Diagnosis not present

## 2014-03-24 DIAGNOSIS — J329 Chronic sinusitis, unspecified: Secondary | ICD-10-CM | POA: Diagnosis not present

## 2014-03-24 DIAGNOSIS — J209 Acute bronchitis, unspecified: Secondary | ICD-10-CM | POA: Diagnosis not present

## 2014-06-11 DIAGNOSIS — R0982 Postnasal drip: Secondary | ICD-10-CM | POA: Diagnosis not present

## 2014-06-11 DIAGNOSIS — J309 Allergic rhinitis, unspecified: Secondary | ICD-10-CM | POA: Diagnosis not present

## 2014-06-11 DIAGNOSIS — J40 Bronchitis, not specified as acute or chronic: Secondary | ICD-10-CM | POA: Diagnosis not present

## 2014-07-09 DIAGNOSIS — G4700 Insomnia, unspecified: Secondary | ICD-10-CM | POA: Diagnosis not present

## 2014-07-09 DIAGNOSIS — I10 Essential (primary) hypertension: Secondary | ICD-10-CM | POA: Diagnosis not present

## 2014-07-09 DIAGNOSIS — Z0000 Encounter for general adult medical examination without abnormal findings: Secondary | ICD-10-CM | POA: Diagnosis not present

## 2014-07-09 DIAGNOSIS — R05 Cough: Secondary | ICD-10-CM | POA: Diagnosis not present

## 2014-07-09 DIAGNOSIS — M720 Palmar fascial fibromatosis [Dupuytren]: Secondary | ICD-10-CM | POA: Diagnosis not present

## 2014-07-09 DIAGNOSIS — E782 Mixed hyperlipidemia: Secondary | ICD-10-CM | POA: Diagnosis not present

## 2014-07-09 DIAGNOSIS — Z86010 Personal history of colonic polyps: Secondary | ICD-10-CM | POA: Diagnosis not present

## 2014-07-09 DIAGNOSIS — K219 Gastro-esophageal reflux disease without esophagitis: Secondary | ICD-10-CM | POA: Diagnosis not present

## 2014-07-16 DIAGNOSIS — L219 Seborrheic dermatitis, unspecified: Secondary | ICD-10-CM | POA: Diagnosis not present

## 2014-07-16 DIAGNOSIS — D225 Melanocytic nevi of trunk: Secondary | ICD-10-CM | POA: Diagnosis not present

## 2014-07-23 DIAGNOSIS — J209 Acute bronchitis, unspecified: Secondary | ICD-10-CM | POA: Diagnosis not present

## 2014-07-23 DIAGNOSIS — J449 Chronic obstructive pulmonary disease, unspecified: Secondary | ICD-10-CM | POA: Diagnosis not present

## 2014-07-30 DIAGNOSIS — J449 Chronic obstructive pulmonary disease, unspecified: Secondary | ICD-10-CM | POA: Diagnosis not present

## 2014-07-30 DIAGNOSIS — R42 Dizziness and giddiness: Secondary | ICD-10-CM | POA: Diagnosis not present

## 2014-07-30 DIAGNOSIS — J209 Acute bronchitis, unspecified: Secondary | ICD-10-CM | POA: Diagnosis not present

## 2014-08-20 DIAGNOSIS — Z0100 Encounter for examination of eyes and vision without abnormal findings: Secondary | ICD-10-CM | POA: Diagnosis not present

## 2014-08-20 DIAGNOSIS — I10 Essential (primary) hypertension: Secondary | ICD-10-CM | POA: Diagnosis not present

## 2014-08-20 DIAGNOSIS — M79641 Pain in right hand: Secondary | ICD-10-CM | POA: Diagnosis not present

## 2014-08-26 DIAGNOSIS — I10 Essential (primary) hypertension: Secondary | ICD-10-CM | POA: Diagnosis not present

## 2014-08-26 DIAGNOSIS — M79641 Pain in right hand: Secondary | ICD-10-CM | POA: Diagnosis not present

## 2014-09-02 DIAGNOSIS — Z0100 Encounter for examination of eyes and vision without abnormal findings: Secondary | ICD-10-CM | POA: Diagnosis not present

## 2014-09-02 DIAGNOSIS — H2513 Age-related nuclear cataract, bilateral: Secondary | ICD-10-CM | POA: Diagnosis not present

## 2014-11-10 DIAGNOSIS — M25473 Effusion, unspecified ankle: Secondary | ICD-10-CM | POA: Diagnosis not present

## 2014-11-10 DIAGNOSIS — Z23 Encounter for immunization: Secondary | ICD-10-CM | POA: Diagnosis not present

## 2014-11-12 DIAGNOSIS — M19041 Primary osteoarthritis, right hand: Secondary | ICD-10-CM | POA: Diagnosis not present

## 2014-12-23 DIAGNOSIS — S39012A Strain of muscle, fascia and tendon of lower back, initial encounter: Secondary | ICD-10-CM | POA: Diagnosis not present

## 2014-12-31 DIAGNOSIS — D225 Melanocytic nevi of trunk: Secondary | ICD-10-CM | POA: Diagnosis not present

## 2014-12-31 DIAGNOSIS — L570 Actinic keratosis: Secondary | ICD-10-CM | POA: Diagnosis not present

## 2014-12-31 DIAGNOSIS — X32XXXD Exposure to sunlight, subsequent encounter: Secondary | ICD-10-CM | POA: Diagnosis not present

## 2014-12-31 DIAGNOSIS — B353 Tinea pedis: Secondary | ICD-10-CM | POA: Diagnosis not present

## 2015-02-18 ENCOUNTER — Emergency Department (HOSPITAL_COMMUNITY): Payer: Commercial Managed Care - HMO

## 2015-02-18 ENCOUNTER — Emergency Department (HOSPITAL_COMMUNITY)
Admission: EM | Admit: 2015-02-18 | Discharge: 2015-02-18 | Disposition: A | Discharge status: Home / Self Care | Payer: Commercial Managed Care - HMO | Attending: Emergency Medicine | Admitting: Emergency Medicine

## 2015-02-18 ENCOUNTER — Encounter (HOSPITAL_COMMUNITY): Payer: Self-pay

## 2015-02-18 DIAGNOSIS — N189 Chronic kidney disease, unspecified: Secondary | ICD-10-CM | POA: Insufficient documentation

## 2015-02-18 DIAGNOSIS — E785 Hyperlipidemia, unspecified: Secondary | ICD-10-CM | POA: Insufficient documentation

## 2015-02-18 DIAGNOSIS — R0602 Shortness of breath: Secondary | ICD-10-CM | POA: Diagnosis not present

## 2015-02-18 DIAGNOSIS — J449 Chronic obstructive pulmonary disease, unspecified: Secondary | ICD-10-CM

## 2015-02-18 DIAGNOSIS — E7800 Pure hypercholesterolemia, unspecified: Secondary | ICD-10-CM | POA: Diagnosis not present

## 2015-02-18 DIAGNOSIS — Z79899 Other long term (current) drug therapy: Secondary | ICD-10-CM | POA: Insufficient documentation

## 2015-02-18 DIAGNOSIS — Z7982 Long term (current) use of aspirin: Secondary | ICD-10-CM | POA: Insufficient documentation

## 2015-02-18 DIAGNOSIS — M1990 Unspecified osteoarthritis, unspecified site: Secondary | ICD-10-CM | POA: Insufficient documentation

## 2015-02-18 DIAGNOSIS — Z7951 Long term (current) use of inhaled steroids: Secondary | ICD-10-CM | POA: Diagnosis not present

## 2015-02-18 DIAGNOSIS — K219 Gastro-esophageal reflux disease without esophagitis: Secondary | ICD-10-CM | POA: Insufficient documentation

## 2015-02-18 DIAGNOSIS — J441 Chronic obstructive pulmonary disease with (acute) exacerbation: Secondary | ICD-10-CM | POA: Insufficient documentation

## 2015-02-18 DIAGNOSIS — I129 Hypertensive chronic kidney disease with stage 1 through stage 4 chronic kidney disease, or unspecified chronic kidney disease: Secondary | ICD-10-CM | POA: Diagnosis not present

## 2015-02-18 DIAGNOSIS — Z87891 Personal history of nicotine dependence: Secondary | ICD-10-CM | POA: Diagnosis not present

## 2015-02-18 DIAGNOSIS — R05 Cough: Secondary | ICD-10-CM | POA: Diagnosis not present

## 2015-02-18 DIAGNOSIS — R062 Wheezing: Secondary | ICD-10-CM | POA: Diagnosis not present

## 2015-02-18 LAB — CBC WITH DIFFERENTIAL/PLATELET
BASOS ABS: 0 10*3/uL (ref 0.0–0.1)
Basophils Relative: 0 %
EOS PCT: 1 %
Eosinophils Absolute: 0.1 10*3/uL (ref 0.0–0.7)
HCT: 37.7 % — ABNORMAL LOW (ref 39.0–52.0)
HEMOGLOBIN: 12.7 g/dL — AB (ref 13.0–17.0)
LYMPHS ABS: 1 10*3/uL (ref 0.7–4.0)
Lymphocytes Relative: 11 %
MCH: 30.4 pg (ref 26.0–34.0)
MCHC: 33.7 g/dL (ref 30.0–36.0)
MCV: 90.2 fL (ref 78.0–100.0)
Monocytes Absolute: 0.7 10*3/uL (ref 0.1–1.0)
Monocytes Relative: 7 %
NEUTROS PCT: 81 %
Neutro Abs: 7.6 10*3/uL (ref 1.7–7.7)
PLATELETS: 154 10*3/uL (ref 150–400)
RBC: 4.18 MIL/uL — AB (ref 4.22–5.81)
RDW: 12.9 % (ref 11.5–15.5)
WBC: 9.4 10*3/uL (ref 4.0–10.5)

## 2015-02-18 LAB — BASIC METABOLIC PANEL
Anion gap: 9 (ref 5–15)
BUN: 31 mg/dL — AB (ref 6–20)
CHLORIDE: 103 mmol/L (ref 101–111)
CO2: 27 mmol/L (ref 22–32)
Calcium: 10 mg/dL (ref 8.9–10.3)
Creatinine, Ser: 2.25 mg/dL — ABNORMAL HIGH (ref 0.61–1.24)
GFR, EST AFRICAN AMERICAN: 29 mL/min — AB (ref >60)
GFR, EST NON AFRICAN AMERICAN: 25 mL/min — AB (ref >60)
Glucose, Bld: 112 mg/dL — ABNORMAL HIGH (ref 65–99)
Potassium: 4.2 mmol/L (ref 3.5–5.1)
SODIUM: 139 mmol/L (ref 135–145)

## 2015-02-18 LAB — BRAIN NATRIURETIC PEPTIDE: B NATRIURETIC PEPTIDE 5: 37.4 pg/mL (ref 0.0–100.0)

## 2015-02-18 MED ORDER — PREDNISONE 20 MG PO TABS
40.0000 mg | ORAL_TABLET | Freq: Every day | ORAL | Status: AC
Start: 2015-02-18 — End: ?

## 2015-02-18 MED ORDER — TIOTROPIUM BROMIDE MONOHYDRATE 18 MCG IN CAPS: 18.0000 ug | ORAL_CAPSULE | Freq: Every day | RESPIRATORY_TRACT | Status: AC

## 2015-02-18 MED ORDER — SODIUM CHLORIDE 0.9 % IV BOLUS (SEPSIS): 500.0000 mL | Freq: Once | INTRAVENOUS | Status: DC

## 2015-02-18 MED ORDER — ALBUTEROL SULFATE HFA 108 (90 BASE) MCG/ACT IN AERS
2.0000 | INHALATION_SPRAY | RESPIRATORY_TRACT | Status: DC | PRN
  Administered 2015-02-18: 2 via RESPIRATORY_TRACT
  Filled 2015-02-18: qty 6.7

## 2015-02-18 MED ORDER — IPRATROPIUM-ALBUTEROL 0.5-2.5 (3) MG/3ML IN SOLN
3.0000 mL | Freq: Once | RESPIRATORY_TRACT | Status: AC
  Administered 2015-02-18: 3 mL via RESPIRATORY_TRACT
  Filled 2015-02-18: qty 3

## 2015-02-18 MED ORDER — PREDNISONE 20 MG PO TABS
40.0000 mg | ORAL_TABLET | Freq: Once | ORAL | Status: AC
  Administered 2015-02-18: 40 mg via ORAL
  Filled 2015-02-18: qty 2

## 2015-02-18 MED ORDER — AEROCHAMBER PLUS W/MASK MISC
1.0000 | Freq: Once | Status: AC
  Administered 2015-02-18: 1
  Filled 2015-02-18 (×4): qty 1

## 2015-02-18 NOTE — ED Notes (Signed)
Bed: [facility] Expected date:  Expected time:  Means of arrival:  Comments: EMS- 81 yo F, elevated INR

## 2015-02-18 NOTE — ED Notes (Signed)
Patient was alert, oriented and stable upon discharge. RN went over AVS and patient had no further questions.  

## 2015-02-18 NOTE — ED Notes (Signed)
Contacted pharmacy regarding pacer

## 2015-02-18 NOTE — ED Notes (Signed)
He c/o uri sx x 5 days.  He has audible fine exp. wheezes at present and is in no distress.

## 2015-02-18 NOTE — ED Provider Notes (Signed)
CSN: [id number]     Arrival date & time 02/18/15  1024 History   First MD Initiated Contact with Patient 02/18/15 1229     Chief Complaint  Patient presents with  . URI  . Wheezing     (Consider location/radiation/quality/duration/timing/severity/associated sxs/prior Treatment) HPI Comments: 81 year old male with past medical history including hypertension, hyperlipidemia, GERD, CKD who p/w cough and shortness of breath. Patient began having cough associated with nasal congestion 3 days ago. Since then, he has had hoarseness and some mild shortness of breath. He was up most of the night last night because of coughing. He has taken delsym without relief. He denies any fevers, vomiting, diarrhea, abdominal pain, or chest pain. When asked about COPD history, he states that he may have been diagnosed with mild OPD in the past.  Patient is a 81 y.o. male presenting with URI and wheezing. The history is provided by the patient.  URI Associated symptoms: wheezing   Wheezing   Past Medical History  Diagnosis Date  . Chronic kidney disease   . Hypertension   . GERD (gastroesophageal reflux disease)   . Arthritis   . Hernia   . Hyperlipidemia   . Hypercholesterolemia   . Varicose veins of lower extremities with complications 3 weeks ago bleeding episode from  vein  right ankle   Past Surgical History  Procedure Laterality Date  . Appendectomy    . Foot surgery    . Hemorroidectomy    . Cholecystectomy    . Vein ligation and stripping  right greater saphenous vein  about 10 years ago   No family history on file. Social History  Substance Use Topics  . Smoking status: Former Smoker    Quit date: 02/21/1974  . Smokeless tobacco: None  . Alcohol Use: Yes     Comment: occassional  2 beers per week    Review of Systems  Respiratory: Positive for wheezing.    10 Systems reviewed and are negative for acute change except as noted in the HPI.    Allergies  Morphine and related  and Niacin and related  Home Medications   Prior to Admission medications   Medication Sig Start Date End Date Taking? Authorizing Provider  acetaminophen (TYLENOL) 325 MG tablet Take 650 mg by mouth every 6 (six) hours as needed for moderate pain.    Yes Historical Provider, MD  albuterol (PROVENTIL HFA;VENTOLIN HFA) 108 (90 Base) MCG/ACT inhaler Inhale 2 puffs into the lungs every 6 (six) hours as needed for wheezing or shortness of breath.   Yes Historical Provider, MD  aspirin 81 MG chewable tablet Chew 81 mg by mouth every evening.    Yes Historical Provider, MD  benzonatate (TESSALON) 100 MG capsule Take 1 capsule (100 mg total) by mouth every 8 (eight) hours. 02/03/13  Yes Domenic Moras, PA-C  diltiazem (CARDIZEM CD) 240 MG 24 hr capsule Take 240 mg by mouth every morning.    Yes Historical Provider, MD  fenofibrate 160 MG tablet Take 160 mg by mouth daily.   Yes Historical Provider, MD  fluticasone (FLONASE) 50 MCG/ACT nasal spray Place 1 spray into both nostrils daily. 02/06/14  Yes Historical Provider, MD  guaiFENesin (ROBITUSSIN) 100 MG/5ML liquid Take 5-10 mLs (100-200 mg total) by mouth every 4 (four) hours as needed for cough. 02/03/13  Yes Domenic Moras, PA-C  hydrochlorothiazide 25 MG tablet Take 12.5 mg by mouth daily.    Yes Historical Provider, MD  lisinopril (PRINIVIL,ZESTRIL) 40 MG tablet Take 40  mg by mouth daily.   Yes Historical Provider, MD  meclizine (ANTIVERT) 25 MG tablet Take 25 mg by mouth 3 (three) times daily as needed for dizziness.   Yes Historical Provider, MD  omega-3 acid ethyl esters (LOVAZA) 1 G capsule Take 3 g by mouth 2 (two) times daily.     Yes Historical Provider, MD  omeprazole (PRILOSEC) 20 MG capsule Take 20 mg by mouth every morning.    Yes Historical Provider, MD  polyethylene glycol (MIRALAX / GLYCOLAX) packet Take 17 g by mouth daily. Patient taking differently: Take 17 g by mouth daily as needed for mild constipation.  01/27/14  Yes Malvin Johns, MD   pravastatin (PRAVACHOL) 80 MG tablet Take 80 mg by mouth every morning.    Yes Historical Provider, MD  temazepam (RESTORIL) 30 MG capsule Take 30 mg by mouth at bedtime.   Yes Historical Provider, MD  tiotropium (SPIRIVA) 18 MCG inhalation capsule Place 18 mcg into inhaler and inhale daily.   Yes Historical Provider, MD  oseltamivir (TAMIFLU) 75 MG capsule Take 1 capsule (75 mg total) by mouth every 12 (twelve) hours. Patient not taking: Reported on 02/18/2015 02/03/13   Domenic Moras, PA-C   BP 130/73 mmHg  Pulse 88  Temp(Src) 98.4 F (36.9 C) (Oral)  Resp 20  SpO2 96% Physical Exam  Constitutional: He is oriented to person, place, and time. He appears well-developed and well-nourished. No distress.  Frequent cough  HENT:  Head: Normocephalic and atraumatic.  Mouth/Throat: Oropharynx is clear and moist.  Moist mucous membranes  Eyes: Conjunctivae are normal. Pupils are equal, round, and reactive to light.  Neck: Neck supple.  Cardiovascular: Normal rate, regular rhythm and normal heart sounds.   No murmur heard. Pulmonary/Chest:  Very mildly increased WOB related to frequent coughing spells, expiratory wheezes b/l, good air movement  Abdominal: Soft. Bowel sounds are normal. He exhibits no distension. There is no tenderness.  Musculoskeletal: He exhibits no edema.  Neurological: He is alert and oriented to person, place, and time.  Fluent speech  Skin: Skin is warm and dry.  Psychiatric: He has a normal mood and affect. Judgment normal.  Nursing note and vitals reviewed.   ED Course  Procedures (including critical care time) Labs Review Labs Reviewed  BASIC METABOLIC PANEL - Abnormal; Notable for the following:    Glucose, Bld 112 (*)    BUN 31 (*)    Creatinine, Ser 2.25 (*)    GFR calc non Af Amer 25 (*)    GFR calc Af Amer 29 (*)    All other components within normal limits  CBC WITH DIFFERENTIAL/PLATELET - Abnormal; Notable for the following:    RBC 4.18 (*)     Hemoglobin 12.7 (*)    HCT 37.7 (*)    All other components within normal limits  BRAIN NATRIURETIC PEPTIDE    Imaging Review Dg Chest 2 View  02/18/2015  CLINICAL DATA:  Productive cough, shortness of breath for 5 days. Wheezing. EXAM: CHEST  2 VIEW COMPARISON:  02/03/2013 FINDINGS: Mild hyperinflation compatible with COPD. Heart and mediastinal contours are within normal limits. No focal opacities or effusions. No acute bony abnormality. IMPRESSION: Hyperinflation/COPD.  No active disease. Electronically Signed   By: Rolm Baptise M.D.   On: 02/18/2015 13:10   I have personally reviewed and evaluated these lab results as part of my medical decision-making.   EKG Interpretation None     Medications  ipratropium-albuterol (DUONEB) 0.5-2.5 (3) MG/3ML nebulizer solution  3 mL (3 mLs Nebulization Given 02/18/15 1404)  predniSONE (DELTASONE) tablet 40 mg (40 mg Oral Given 02/18/15 1403)  aerochamber plus with mask device 1 each (1 each Other Given 02/18/15 1635)    MDM   Final diagnoses:  Chronic obstructive pulmonary disease, unspecified COPD type ([place])    Pt p/w 3 days of cough and congestion as well as hoarseness. On exam, pt had frequent coughing fits but was in NAD. VS notable for O2 96-98% on RA. Wheezes b/l but good air movement and no respiratory distress. Obtained above labs and CXR. Gave duoneb and prednisone. Obtained above labs which were notable for mildly worse kidney function at 2.25, BNP normal. Patient is tolerating liquids in the ED and I have instructed to continue aggressive oral hydration at home. On reexamination, the patient is breathing more comfortably and is maintaining normal saturations on room air. Although he denies a history of COPD, he does have a significant smoking history and I suspect a diagnosis of COPD given his response to breathing treatments. Provided with prednisone course and albuterol to use at home with instructions on supportive care. Instructed to  follow-up with PCP in a few days for reevaluation as well as possible referral to pulmonology for formal PFT. Return precautions reviewed. Patient voiced understanding of plan and was discharged in satisfactory condition.  Sharlett Iles, MD 02/19/15 [phone]

## 2015-02-18 NOTE — ED Notes (Signed)
Food and drink provided.

## 2015-02-24 DIAGNOSIS — J441 Chronic obstructive pulmonary disease with (acute) exacerbation: Secondary | ICD-10-CM | POA: Diagnosis not present

## 2015-02-24 DIAGNOSIS — N189 Chronic kidney disease, unspecified: Secondary | ICD-10-CM | POA: Diagnosis not present

## 2015-02-27 DIAGNOSIS — J441 Chronic obstructive pulmonary disease with (acute) exacerbation: Secondary | ICD-10-CM | POA: Diagnosis not present

## 2015-03-13 DIAGNOSIS — J449 Chronic obstructive pulmonary disease, unspecified: Secondary | ICD-10-CM | POA: Diagnosis not present

## 2015-04-10 ENCOUNTER — Other Ambulatory Visit: Payer: Self-pay | Admitting: Gastroenterology

## 2015-04-10 DIAGNOSIS — D123 Benign neoplasm of transverse colon: Secondary | ICD-10-CM | POA: Diagnosis not present

## 2015-04-10 DIAGNOSIS — K5730 Diverticulosis of large intestine without perforation or abscess without bleeding: Secondary | ICD-10-CM | POA: Diagnosis not present

## 2015-04-10 DIAGNOSIS — D126 Benign neoplasm of colon, unspecified: Secondary | ICD-10-CM | POA: Diagnosis not present

## 2015-04-10 DIAGNOSIS — D120 Benign neoplasm of cecum: Secondary | ICD-10-CM | POA: Diagnosis not present

## 2015-04-10 DIAGNOSIS — D122 Benign neoplasm of ascending colon: Secondary | ICD-10-CM | POA: Diagnosis not present

## 2015-04-10 DIAGNOSIS — Z86010 Personal history of colonic polyps: Secondary | ICD-10-CM | POA: Diagnosis not present

## 2015-04-10 DIAGNOSIS — D125 Benign neoplasm of sigmoid colon: Secondary | ICD-10-CM | POA: Diagnosis not present

## 2015-05-26 DIAGNOSIS — M542 Cervicalgia: Secondary | ICD-10-CM | POA: Diagnosis not present

## 2015-05-26 DIAGNOSIS — M1710 Unilateral primary osteoarthritis, unspecified knee: Secondary | ICD-10-CM | POA: Diagnosis not present

## 2015-06-26 DIAGNOSIS — R682 Dry mouth, unspecified: Secondary | ICD-10-CM | POA: Diagnosis not present

## 2015-06-26 DIAGNOSIS — R196 Halitosis: Secondary | ICD-10-CM | POA: Diagnosis not present

## 2015-06-26 DIAGNOSIS — H6123 Impacted cerumen, bilateral: Secondary | ICD-10-CM | POA: Diagnosis not present

## 2015-07-16 DIAGNOSIS — Z86010 Personal history of colonic polyps: Secondary | ICD-10-CM | POA: Diagnosis not present

## 2015-07-16 DIAGNOSIS — Z79899 Other long term (current) drug therapy: Secondary | ICD-10-CM | POA: Diagnosis not present

## 2015-07-16 DIAGNOSIS — I10 Essential (primary) hypertension: Secondary | ICD-10-CM | POA: Diagnosis not present

## 2015-07-16 DIAGNOSIS — K219 Gastro-esophageal reflux disease without esophagitis: Secondary | ICD-10-CM | POA: Diagnosis not present

## 2015-07-16 DIAGNOSIS — Z8739 Personal history of other diseases of the musculoskeletal system and connective tissue: Secondary | ICD-10-CM | POA: Diagnosis not present

## 2015-07-16 DIAGNOSIS — N189 Chronic kidney disease, unspecified: Secondary | ICD-10-CM | POA: Diagnosis not present

## 2015-07-16 DIAGNOSIS — Z0000 Encounter for general adult medical examination without abnormal findings: Secondary | ICD-10-CM | POA: Diagnosis not present

## 2015-07-16 DIAGNOSIS — E782 Mixed hyperlipidemia: Secondary | ICD-10-CM | POA: Diagnosis not present

## 2015-07-16 DIAGNOSIS — J449 Chronic obstructive pulmonary disease, unspecified: Secondary | ICD-10-CM | POA: Diagnosis not present

## 2015-08-27 DIAGNOSIS — H2513 Age-related nuclear cataract, bilateral: Secondary | ICD-10-CM | POA: Diagnosis not present

## 2015-10-01 DIAGNOSIS — N183 Chronic kidney disease, stage 3 (moderate): Secondary | ICD-10-CM | POA: Diagnosis not present

## 2015-10-01 DIAGNOSIS — I129 Hypertensive chronic kidney disease with stage 1 through stage 4 chronic kidney disease, or unspecified chronic kidney disease: Secondary | ICD-10-CM | POA: Diagnosis not present

## 2015-10-01 DIAGNOSIS — N2581 Secondary hyperparathyroidism of renal origin: Secondary | ICD-10-CM | POA: Diagnosis not present

## 2015-10-20 DIAGNOSIS — J441 Chronic obstructive pulmonary disease with (acute) exacerbation: Secondary | ICD-10-CM | POA: Diagnosis not present

## 2015-11-17 DIAGNOSIS — L602 Onychogryphosis: Secondary | ICD-10-CM | POA: Diagnosis not present

## 2016-01-25 DIAGNOSIS — L603 Nail dystrophy: Secondary | ICD-10-CM | POA: Diagnosis not present

## 2016-01-25 DIAGNOSIS — L84 Corns and callosities: Secondary | ICD-10-CM | POA: Diagnosis not present

## 2016-02-24 DIAGNOSIS — J069 Acute upper respiratory infection, unspecified: Secondary | ICD-10-CM | POA: Diagnosis not present

## 2016-03-15 DIAGNOSIS — L602 Onychogryphosis: Secondary | ICD-10-CM | POA: Diagnosis not present

## 2016-03-15 DIAGNOSIS — L603 Nail dystrophy: Secondary | ICD-10-CM | POA: Diagnosis not present

## 2016-04-01 DIAGNOSIS — N402 Nodular prostate without lower urinary tract symptoms: Secondary | ICD-10-CM | POA: Diagnosis not present

## 2016-04-01 DIAGNOSIS — N400 Enlarged prostate without lower urinary tract symptoms: Secondary | ICD-10-CM | POA: Diagnosis not present

## 2016-05-02 DIAGNOSIS — J441 Chronic obstructive pulmonary disease with (acute) exacerbation: Secondary | ICD-10-CM | POA: Diagnosis not present

## 2016-05-17 DIAGNOSIS — L84 Corns and callosities: Secondary | ICD-10-CM | POA: Diagnosis not present

## 2016-05-17 DIAGNOSIS — L603 Nail dystrophy: Secondary | ICD-10-CM | POA: Diagnosis not present

## 2016-05-30 DIAGNOSIS — N403 Nodular prostate with lower urinary tract symptoms: Secondary | ICD-10-CM | POA: Diagnosis not present

## 2016-05-30 DIAGNOSIS — R3911 Hesitancy of micturition: Secondary | ICD-10-CM | POA: Diagnosis not present

## 2016-05-30 DIAGNOSIS — N401 Enlarged prostate with lower urinary tract symptoms: Secondary | ICD-10-CM | POA: Diagnosis not present

## 2016-06-08 DIAGNOSIS — K5900 Constipation, unspecified: Secondary | ICD-10-CM | POA: Diagnosis not present

## 2016-06-20 DIAGNOSIS — H2513 Age-related nuclear cataract, bilateral: Secondary | ICD-10-CM | POA: Diagnosis not present

## 2016-06-28 DIAGNOSIS — L84 Corns and callosities: Secondary | ICD-10-CM | POA: Diagnosis not present

## 2016-06-28 DIAGNOSIS — L602 Onychogryphosis: Secondary | ICD-10-CM | POA: Diagnosis not present

## 2016-07-07 DIAGNOSIS — M25511 Pain in right shoulder: Secondary | ICD-10-CM | POA: Diagnosis not present

## 2016-07-07 DIAGNOSIS — I10 Essential (primary) hypertension: Secondary | ICD-10-CM | POA: Diagnosis not present

## 2016-07-07 DIAGNOSIS — R071 Chest pain on breathing: Secondary | ICD-10-CM | POA: Diagnosis not present

## 2016-07-21 ENCOUNTER — Emergency Department (HOSPITAL_COMMUNITY)
Admission: EM | Admit: 2016-07-21 | Discharge: 2016-07-21 | Disposition: A | Discharge status: Home / Self Care | Payer: Medicare HMO | Attending: Emergency Medicine | Admitting: Emergency Medicine

## 2016-07-21 ENCOUNTER — Encounter (HOSPITAL_COMMUNITY): Payer: Self-pay

## 2016-07-21 DIAGNOSIS — Z7982 Long term (current) use of aspirin: Secondary | ICD-10-CM | POA: Diagnosis not present

## 2016-07-21 DIAGNOSIS — I129 Hypertensive chronic kidney disease with stage 1 through stage 4 chronic kidney disease, or unspecified chronic kidney disease: Secondary | ICD-10-CM | POA: Insufficient documentation

## 2016-07-21 DIAGNOSIS — N189 Chronic kidney disease, unspecified: Secondary | ICD-10-CM | POA: Diagnosis not present

## 2016-07-21 DIAGNOSIS — Z79899 Other long term (current) drug therapy: Secondary | ICD-10-CM | POA: Diagnosis not present

## 2016-07-21 DIAGNOSIS — K623 Rectal prolapse: Secondary | ICD-10-CM | POA: Insufficient documentation

## 2016-07-21 DIAGNOSIS — Z87891 Personal history of nicotine dependence: Secondary | ICD-10-CM | POA: Insufficient documentation

## 2016-07-21 DIAGNOSIS — K5909 Other constipation: Secondary | ICD-10-CM | POA: Diagnosis not present

## 2016-07-21 MED ORDER — MAGNESIUM HYDROXIDE 400 MG/5ML PO SUSP
960.0000 mL | Freq: Once | ORAL | Status: AC
  Administered 2016-07-21: 960 mL via RECTAL
  Filled 2016-07-21: qty 240

## 2016-07-21 NOTE — ED Triage Notes (Signed)
States no bowel movement for 2 days states takes pain medication and this constipation happens all the time and takes miralax and is not helping.

## 2016-07-21 NOTE — ED Notes (Signed)
Patient is alert and oriented x3.  He was given DC instructions and follow up visit instructions.  Patient gave verbal understanding.  He was DC ambulatory under his own power to home.  V/S stable.  He was not showing any signs of distress on DC 

## 2016-07-21 NOTE — ED Notes (Signed)
Pt states they feel 60-80% better

## 2016-07-21 NOTE — ED Provider Notes (Signed)
TIME SEEN: 4:52 AM  CHIEF COMPLAINT: Constipation  HPI: Patient is an 83 year old male with history of hypertension, hyperlipidemia, chronic kidney disease who presents emergency department with constipation. Reports this is a chronic issue for him that normally resolved with MiraLAX and Fleet enemas. Has tried both at home without relief. No abdominal pain or distention. No nausea or vomiting. No fever. Has had history of appendectomy, cholecystectomy and hernia repair. No history of bowel obstruction.  ROS: See HPI Constitutional: no fever  Eyes: no drainage  ENT: no runny nose   Cardiovascular:  no chest pain  Resp: no SOB  GI: no vomiting GU: no dysuria Integumentary: no rash  Allergy: no hives  Musculoskeletal: no leg swelling  Neurological: no slurred speech ROS otherwise negative  PAST MEDICAL HISTORY/PAST SURGICAL HISTORY:  Past Medical History:  Diagnosis Date  . Arthritis   . Chronic kidney disease   . GERD (gastroesophageal reflux disease)   . Hernia   . Hypercholesterolemia   . Hyperlipidemia   . Hypertension   . Varicose veins of lower extremities with complications 3 weeks ago bleeding episode from  vein  right ankle    MEDICATIONS:  Prior to Admission medications   Medication Sig Start Date End Date Taking? Authorizing Provider  acetaminophen (TYLENOL) 325 MG tablet Take 650 mg by mouth every 6 (six) hours as needed for moderate pain.     [provider]  albuterol (PROVENTIL HFA;VENTOLIN HFA) 108 (90 Base) MCG/ACT inhaler Inhale 2 puffs into the lungs every 6 (six) hours as needed for wheezing or shortness of breath.    [provider]  aspirin 81 MG chewable tablet Chew 81 mg by mouth every evening.     [provider]  benzonatate (TESSALON) 100 MG capsule Take 1 capsule (100 mg total) by mouth every 8 (eight) hours. 02/03/13   Domenic Moras, PA-C  diltiazem (CARDIZEM CD) 240 MG 24 hr capsule Take 240 mg by mouth every morning.      [provider]  fenofibrate 160 MG tablet Take 160 mg by mouth daily.    [provider]  fluticasone (FLONASE) 50 MCG/ACT nasal spray Place 1 spray into both nostrils daily. 02/06/14   [provider]  guaiFENesin (ROBITUSSIN) 100 MG/5ML liquid Take 5-10 mLs (100-200 mg total) by mouth every 4 (four) hours as needed for cough. 02/03/13   Domenic Moras, PA-C  hydrochlorothiazide 25 MG tablet Take 12.5 mg by mouth daily.     [provider]  lisinopril (PRINIVIL,ZESTRIL) 40 MG tablet Take 40 mg by mouth daily.    [provider]  meclizine (ANTIVERT) 25 MG tablet Take 25 mg by mouth 3 (three) times daily as needed for dizziness.    [provider]  omega-3 acid ethyl esters (LOVAZA) 1 G capsule Take 3 g by mouth 2 (two) times daily.      [provider]  omeprazole (PRILOSEC) 20 MG capsule Take 20 mg by mouth every morning.     [provider]  polyethylene glycol (MIRALAX / GLYCOLAX) packet Take 17 g by mouth daily. Patient taking differently: Take 17 g by mouth daily as needed for mild constipation.  01/27/14   Malvin Johns, MD  pravastatin (PRAVACHOL) 80 MG tablet Take 80 mg by mouth every morning.     [provider]  predniSONE (DELTASONE) 20 MG tablet Take 2 tablets (40 mg total) by mouth daily. 02/18/15   Little, Wenda Overland, MD  temazepam (RESTORIL) 30 MG capsule  Take 30 mg by mouth at bedtime.    [provider]  tiotropium (SPIRIVA HANDIHALER) 18 MCG inhalation capsule Place 1 capsule (18 mcg total) into inhaler and inhale daily. 02/18/15   Little, Wenda Overland, MD    ALLERGIES:  Allergies  Allergen Reactions  . Morphine And Related     Pt. States he has not taken morphine for "years" but that it causes him to dream excessively.  . Niacin And Related     SOCIAL HISTORY:  Social History  Substance Use Topics  . Smoking status: Former Smoker    Quit date: 02/21/1974  . Smokeless tobacco:  Never Used  . Alcohol use Yes     Comment: occassional  2 beers per week    FAMILY HISTORY: History reviewed. No pertinent family history.  EXAM: BP (!) 162/97 (BP Location: Right Arm)   Pulse 93   Temp 98.4 F (36.9 C) (Oral)   Resp (!) 22   Ht 5\' 10"  (1.778 m)   Wt 81.6 kg (180 lb)   SpO2 97%   BMI 25.83 kg/m  CONSTITUTIONAL: Alert and oriented and responds appropriately to questions. Well-appearing; well-nourished, Elderly, in no distress HEAD: Normocephalic EYES: Conjunctivae clear, pupils appear equal, EOMI ENT: normal nose; moist mucous membranes NECK: Supple, no meningismus, no nuchal rigidity, no LAD  CARD: RRR; S1 and S2 appreciated; no murmurs, no clicks, no rubs, no gallops RESP: Normal chest excursion without splinting or tachypnea; breath sounds clear and equal bilaterally; no wheezes, no rhonchi, no rales, no hypoxia or respiratory distress, speaking full sentences ABD/GI: Normal bowel sounds; non-distended; soft, non-tender, no rebound, no guarding, no peritoneal signs, no hepatosplenomegaly RECTAL:  Normal rectal tone, no gross blood or melena, no hemorrhoids appreciated, nontender rectal exam, normal brown-appearing stool exam. He does have a large amount of hard stool in the rectal vault consistent with mild impaction BACK:  The back appears normal and is non-tender to palpation, there is no CVA tenderness EXT: Normal ROM in all joints; non-tender to palpation; no edema; normal capillary refill; no cyanosis, no calf tenderness or swelling    SKIN: Normal color for age and race; warm; no rash NEURO: Moves all extremities equally PSYCH: The patient's mood and manner are appropriate. Grooming and personal hygiene are appropriate.  MEDICAL DECISION MAKING: Patient here with constipation. No signs or symptoms to suggest bowel obstruction. Abdomen is soft and nontender. He does have mild fecal impaction that I am unable to disimpact in the ED. We'll give him a SMOG enema  and attempt to continue to disimpact patient.  ED PROGRESS: Patient has had a large bowel movement after enema. He did have a small rectal prolapse after having a bowel movement that I was able to manually reduce. I feel he is safe to be discharged home and continue MiraLAX twice a day, Colace twice a day. Recommend increased water and fiber intake at home. Discussed return precautions. Patient comfortable with this plan.   At this time, I do not feel there is any life-threatening condition present. I have reviewed and discussed all results (EKG, imaging, lab, urine as appropriate) and exam findings with patient/family. I have reviewed nursing notes and appropriate previous records.  I feel the patient is safe to be discharged home without further emergent workup and can continue workup as an outpatient as needed. Discussed usual and customary return precautions. Patient/family verbalize understanding and are comfortable with this plan.  Outpatient follow-up has been provided if needed. All questions have  been answered.      Merrik Puebla, Delice Bison, DO 07/21/16 [phone]

## 2016-07-21 NOTE — Discharge Instructions (Addendum)
I recommend that you increase your water and fiber intake. If you are not able to eat foods high in fiber, you may use Benefiber or Metamucil over-the-counter. I also recommend you use MiraLAX 2 times a day and Colace 100 mg twice a day to help with bowel movements. These medications are over the counter.  Please note that some of these medications may cause you to have abdominal cramping which is normal. If you develop severe abdominal pain, fever, vomiting, distention of your abdomen, unable to have a bowel movement for 5 days or are not passing gas, please return to the hospital.

## 2016-07-28 DIAGNOSIS — I10 Essential (primary) hypertension: Secondary | ICD-10-CM | POA: Diagnosis not present

## 2016-07-28 DIAGNOSIS — E782 Mixed hyperlipidemia: Secondary | ICD-10-CM | POA: Diagnosis not present

## 2016-07-28 DIAGNOSIS — L570 Actinic keratosis: Secondary | ICD-10-CM | POA: Diagnosis not present

## 2016-07-28 DIAGNOSIS — N189 Chronic kidney disease, unspecified: Secondary | ICD-10-CM | POA: Diagnosis not present

## 2016-07-28 DIAGNOSIS — Z86010 Personal history of colonic polyps: Secondary | ICD-10-CM | POA: Diagnosis not present

## 2016-07-28 DIAGNOSIS — Z0000 Encounter for general adult medical examination without abnormal findings: Secondary | ICD-10-CM | POA: Diagnosis not present

## 2016-07-28 DIAGNOSIS — N402 Nodular prostate without lower urinary tract symptoms: Secondary | ICD-10-CM | POA: Diagnosis not present

## 2016-07-28 DIAGNOSIS — K219 Gastro-esophageal reflux disease without esophagitis: Secondary | ICD-10-CM | POA: Diagnosis not present

## 2016-07-28 DIAGNOSIS — J449 Chronic obstructive pulmonary disease, unspecified: Secondary | ICD-10-CM | POA: Diagnosis not present

## 2016-08-08 DIAGNOSIS — C7982 Secondary malignant neoplasm of genital organs: Secondary | ICD-10-CM | POA: Diagnosis not present

## 2016-08-08 DIAGNOSIS — C689 Malignant neoplasm of urinary organ, unspecified: Secondary | ICD-10-CM | POA: Diagnosis not present

## 2016-08-08 DIAGNOSIS — N402 Nodular prostate without lower urinary tract symptoms: Secondary | ICD-10-CM | POA: Diagnosis not present

## 2016-08-15 DIAGNOSIS — L84 Corns and callosities: Secondary | ICD-10-CM | POA: Diagnosis not present

## 2016-08-15 DIAGNOSIS — L602 Onychogryphosis: Secondary | ICD-10-CM | POA: Diagnosis not present

## 2016-08-19 DIAGNOSIS — N402 Nodular prostate without lower urinary tract symptoms: Secondary | ICD-10-CM | POA: Diagnosis not present

## 2016-08-19 DIAGNOSIS — C61 Malignant neoplasm of prostate: Secondary | ICD-10-CM | POA: Diagnosis not present

## 2016-09-08 DIAGNOSIS — C678 Malignant neoplasm of overlapping sites of bladder: Secondary | ICD-10-CM | POA: Diagnosis not present

## 2016-09-08 DIAGNOSIS — N402 Nodular prostate without lower urinary tract symptoms: Secondary | ICD-10-CM | POA: Diagnosis not present

## 2016-09-14 DIAGNOSIS — R2681 Unsteadiness on feet: Secondary | ICD-10-CM | POA: Diagnosis not present

## 2016-09-14 DIAGNOSIS — G250 Essential tremor: Secondary | ICD-10-CM | POA: Diagnosis not present

## 2016-09-14 DIAGNOSIS — H6122 Impacted cerumen, left ear: Secondary | ICD-10-CM | POA: Diagnosis not present

## 2016-10-10 DIAGNOSIS — L602 Onychogryphosis: Secondary | ICD-10-CM | POA: Diagnosis not present

## 2016-10-10 DIAGNOSIS — L84 Corns and callosities: Secondary | ICD-10-CM | POA: Diagnosis not present

## 2016-10-17 DIAGNOSIS — M109 Gout, unspecified: Secondary | ICD-10-CM | POA: Diagnosis not present

## 2016-11-14 DIAGNOSIS — L84 Corns and callosities: Secondary | ICD-10-CM | POA: Diagnosis not present

## 2016-11-14 DIAGNOSIS — L602 Onychogryphosis: Secondary | ICD-10-CM | POA: Diagnosis not present

## 2016-11-24 DIAGNOSIS — Z23 Encounter for immunization: Secondary | ICD-10-CM | POA: Diagnosis not present

## 2016-11-24 DIAGNOSIS — Z8551 Personal history of malignant neoplasm of bladder: Secondary | ICD-10-CM | POA: Diagnosis not present

## 2016-11-24 DIAGNOSIS — E663 Overweight: Secondary | ICD-10-CM | POA: Diagnosis not present

## 2016-12-15 DIAGNOSIS — L84 Corns and callosities: Secondary | ICD-10-CM | POA: Diagnosis not present

## 2016-12-15 DIAGNOSIS — L602 Onychogryphosis: Secondary | ICD-10-CM | POA: Diagnosis not present

## 2017-01-16 ENCOUNTER — Emergency Department (HOSPITAL_COMMUNITY): Payer: Medicare HMO

## 2017-01-16 ENCOUNTER — Encounter (HOSPITAL_COMMUNITY): Payer: Self-pay | Admitting: Emergency Medicine

## 2017-01-16 ENCOUNTER — Emergency Department (HOSPITAL_COMMUNITY)
Admission: EM | Admit: 2017-01-16 | Discharge: 2017-01-16 | Disposition: A | Discharge status: Home / Self Care | Payer: Medicare HMO | Attending: Emergency Medicine | Admitting: Emergency Medicine

## 2017-01-16 DIAGNOSIS — Z87891 Personal history of nicotine dependence: Secondary | ICD-10-CM | POA: Insufficient documentation

## 2017-01-16 DIAGNOSIS — N182 Chronic kidney disease, stage 2 (mild): Secondary | ICD-10-CM | POA: Insufficient documentation

## 2017-01-16 DIAGNOSIS — I129 Hypertensive chronic kidney disease with stage 1 through stage 4 chronic kidney disease, or unspecified chronic kidney disease: Secondary | ICD-10-CM | POA: Insufficient documentation

## 2017-01-16 DIAGNOSIS — R05 Cough: Secondary | ICD-10-CM | POA: Diagnosis not present

## 2017-01-16 DIAGNOSIS — R059 Cough, unspecified: Secondary | ICD-10-CM

## 2017-01-16 MED ORDER — BENZONATATE 100 MG PO CAPS: 100.0000 mg | ORAL_CAPSULE | Freq: Three times a day (TID) | ORAL | 0 refills | Status: AC

## 2017-01-16 NOTE — ED Triage Notes (Signed)
Patient c/o productive cough x4 weeks. Denies chest pain and SOB. Patient also c/o constipation x2 days. Reports small BM prior to triage. Denies abdominal pain, N/V/D. Hx bladder cancer mets to lymph nodes.

## 2017-01-16 NOTE — ED Provider Notes (Signed)
Emergency Department Provider Note   I have reviewed the triage vital signs and the nursing notes.   HISTORY  Chief Complaint Cough and Constipation   HPI Ronald [name] is a 83 y.o. male with PMH of CKD, HLD, HTN, and prostate cancer since to the emergency department for evaluation of prolonged coughing and constipation.  The patient states that his cough is been present for 4-5 weeks.  He states it is not productive.  He denies hemoptysis.  No fevers or chills.  He does not have associated chest pain or dyspnea.  No burning in the upper abdomen.  No nausea, vomiting, or diarrhea.  He states he was seen at the [place] early during the cough and was sent home with steroids which did not improve his symptoms.  Patient is also complaining of constipation for the past 2 days.  Patient states that he was waiting in the waiting room and suddenly felt the urge to go.  He proceeded to have a large bowel movement and is no longer feeling uncomfortable.  Denies rectal pain or blood in the stools. He is on stool softener at home.    Past Medical History:  Diagnosis Date  . Arthritis   . Chronic kidney disease   . GERD (gastroesophageal reflux disease)   . Hernia   . Hypercholesterolemia   . Hyperlipidemia   . Hypertension   . Varicose veins of lower extremities with complications 3 weeks ago bleeding episode from  vein  right ankle    There are no active problems to display for this patient.   Past Surgical History:  Procedure Laterality Date  . APPENDECTOMY    . CHOLECYSTECTOMY    . FOOT SURGERY    . HEMORROIDECTOMY    . VEIN LIGATION AND STRIPPING  right greater saphenous vein  about 10 years ago    Current Outpatient Rx  . Order #: [id number] Class: Historical Med  . Order #: [id number] Class: Historical Med  . Order #: [id number] Class: Historical Med  . Order #: [id number] Class: Print  . Order #: [id number] Class: Historical Med  . Order #: [id number] Class: Historical Med  . Order  #: [id number] Class: Historical Med  . Order #: [id number] Class: Print  . Order #: [id number] Class: Historical Med  . Order #: [id number] Class: Historical Med  . Order #: [id number] Class: Historical Med  . Order #: [id number] Class: Historical Med  . Order #: [id number] Class: Historical Med  . Order #: [id number] Class: Print  . Order #: [id number] Class: Historical Med  . Order #: [id number] Class: Print  . Order #: [id number] Class: Historical Med  . Order #: [id number] Class: Print    Allergies Morphine and related and Niacin and related  History reviewed. No pertinent family history.  Social History Social History   Tobacco Use  . Smoking status: Former Smoker    Last attempt to quit: 02/21/1974    Years since quitting: 42.9  . Smokeless tobacco: Never Used  Substance Use Topics  . Alcohol use: Yes    Comment: occassional  2 beers per week  . Drug use: No    Review of Systems  Constitutional: No fever/chills Eyes: No visual changes. ENT: No sore throat. Cardiovascular: Denies chest pain. Respiratory: Denies shortness of breath. Positive cough.  Gastrointestinal: No abdominal pain.  No nausea, no vomiting.  No diarrhea. Positive constipation (resolved). Genitourinary: Negative for dysuria. Musculoskeletal: Negative for back pain. Skin: Negative for rash. Neurological: Negative for headaches, focal weakness or numbness.  10-point ROS otherwise negative.  ____________________________________________   PHYSICAL  EXAM:  VITAL SIGNS: ED Triage Vitals  Enc Vitals Group     BP 01/16/17 1904 (!) 165/83     Pulse Rate 01/16/17 1904 83     Resp 01/16/17 1904 18     Temp 01/16/17 1904 98 F (36.7 C)     Temp Source 01/16/17 1904 Oral     SpO2 01/16/17 1904 98 %     Pain Score 01/16/17 1903 0   Constitutional: Alert and oriented. Well appearing and in no acute distress. Eyes: Conjunctivae are normal.  Head: Atraumatic. Nose: No congestion/rhinnorhea. Mouth/Throat: Mucous membranes are  moist.  Oropharynx non-erythematous. Neck: No stridor.   Cardiovascular: Normal rate, regular rhythm. Good peripheral circulation. Grossly normal heart sounds.   Respiratory: Normal respiratory effort.  No retractions. Lungs CTAB. Gastrointestinal: Soft and nontender. No distention.  Musculoskeletal: No lower extremity tenderness nor edema. No gross deformities of extremities. Neurologic:  Normal speech and language. No gross focal neurologic deficits are appreciated.  Skin:  Skin is warm, dry and intact. No rash noted.  ____________________________________________  PIRJJOACZ  Dg Chest 2 View  Result Date: 01/16/2017 CLINICAL DATA:  Productive cough for 4 weeks EXAM: CHEST  2 VIEW COMPARISON:  07/07/2016 FINDINGS: Hyperinflation with mild bronchitic changes. No focal consolidation or effusion. Normal heart size. No pneumothorax. Surgical clips in the right upper quadrant. IMPRESSION: No active cardiopulmonary disease. Hyperinflation with chronic appearing bronchitic changes Electronically Signed   By: Donavan Foil M.D.   On: 01/16/2017 19:49    ____________________________________________   PROCEDURES  Procedure(s) performed:   Procedures  None ____________________________________________   INITIAL IMPRESSION / ASSESSMENT AND PLAN / ED COURSE  Pertinent labs & imaging results that were available during my care of the patient were reviewed by me and considered in my medical decision making (see chart for details).  Patient presents emergency department for evaluation of cough for the past 4-5 weeks.  No chest pain or dyspnea.  No infection symptoms.  Patient is on lisinopril and I wonder if this may be causing some of his symptoms.  He is seeing his primary care physician on Thursday and will discuss this with them.  Chest x-ray pending.  Plan for cough tablets and PCP follow-up to discuss blood pressure medications if x-ray shows no acute infiltrate.  No concern for underlying  DVT/PE. No symptoms to increase concern for vascular disease or metastatic disease at this time.   At this time, I do not feel there is any life-threatening condition present. I have reviewed and discussed all results (EKG, imaging, lab, urine as appropriate), exam findings with patient. I have reviewed nursing notes and appropriate previous records.  I feel the patient is safe to be discharged home without further emergent workup. Discussed usual and customary return precautions. Patient and family (if present) verbalize understanding and are comfortable with this plan.  Patient will follow-up with their primary care provider. If they do not have a primary care provider, information for follow-up has been provided to them. All questions have been answered.  ____________________________________________  FINAL CLINICAL IMPRESSION(S) / ED DIAGNOSES  Final diagnoses:  Cough    NEW OUTPATIENT MEDICATIONS STARTED DURING THIS VISIT:  Tessalon    Note:  This document was prepared using Systems analyst and may include unintentional dictation errors.  Nanda Quinton, MD Emergency Medicine    Dhana Totton, [facility], MD 01/16/17 [phone]

## 2017-01-16 NOTE — Discharge Instructions (Signed)
We believe your persistent cough is a result of a viral syndrome for which your symptoms have still not quite resolved.  Sometimes it takes many weeks to completely go away, especially if you smoke or have chronic lung problems.  Please take any medications prescribed and follow up as recommended with your regular doctor.  Another possibility for your cough is that you may be experiencing a side effect of your blood pressure medication, Lisinopril. Discuss this possibility with your PCP on Thursday but continue taking this medication until told to stop or change by your PCP.   If you develop any new or worsening symptoms, including but not limited to fever, persistent vomiting, worsening shortness of breath, or other symptoms that concern you, please return to the Emergency Department immediately.

## 2017-01-16 NOTE — ED Notes (Signed)
Pt ambulatory and independent at discharge. Verbalized understanding of discharge  Instructions.

## 2017-01-28 ENCOUNTER — Other Ambulatory Visit: Payer: Self-pay

## 2017-01-28 ENCOUNTER — Emergency Department (HOSPITAL_COMMUNITY)
Admission: EM | Admit: 2017-01-28 | Discharge: 2017-01-28 | Disposition: A | Discharge status: Home / Self Care | Payer: Medicare HMO | Attending: Emergency Medicine | Admitting: Emergency Medicine

## 2017-01-28 ENCOUNTER — Encounter (HOSPITAL_COMMUNITY): Payer: Self-pay

## 2017-01-28 ENCOUNTER — Emergency Department (HOSPITAL_COMMUNITY): Payer: Medicare HMO

## 2017-01-28 DIAGNOSIS — I129 Hypertensive chronic kidney disease with stage 1 through stage 4 chronic kidney disease, or unspecified chronic kidney disease: Secondary | ICD-10-CM | POA: Insufficient documentation

## 2017-01-28 DIAGNOSIS — Z7982 Long term (current) use of aspirin: Secondary | ICD-10-CM | POA: Diagnosis not present

## 2017-01-28 DIAGNOSIS — Z87891 Personal history of nicotine dependence: Secondary | ICD-10-CM | POA: Insufficient documentation

## 2017-01-28 DIAGNOSIS — R339 Retention of urine, unspecified: Secondary | ICD-10-CM | POA: Insufficient documentation

## 2017-01-28 DIAGNOSIS — R030 Elevated blood-pressure reading, without diagnosis of hypertension: Secondary | ICD-10-CM | POA: Diagnosis not present

## 2017-01-28 DIAGNOSIS — Z7902 Long term (current) use of antithrombotics/antiplatelets: Secondary | ICD-10-CM | POA: Insufficient documentation

## 2017-01-28 DIAGNOSIS — N189 Chronic kidney disease, unspecified: Secondary | ICD-10-CM | POA: Insufficient documentation

## 2017-01-28 DIAGNOSIS — R05 Cough: Secondary | ICD-10-CM | POA: Diagnosis not present

## 2017-01-28 DIAGNOSIS — Z79899 Other long term (current) drug therapy: Secondary | ICD-10-CM | POA: Insufficient documentation

## 2017-01-28 LAB — URINALYSIS, ROUTINE W REFLEX MICROSCOPIC
Bilirubin Urine: NEGATIVE
Glucose, UA: NEGATIVE mg/dL
Ketones, ur: NEGATIVE mg/dL
Leukocytes, UA: NEGATIVE
Nitrite: NEGATIVE
Protein, ur: 100 mg/dL — AB
SQUAMOUS EPITHELIAL / LPF: NONE SEEN
Specific Gravity, Urine: 1.016 (ref 1.005–1.030)
pH: 5 (ref 5.0–8.0)

## 2017-01-28 LAB — BASIC METABOLIC PANEL
ANION GAP: 8 (ref 5–15)
BUN: 57 mg/dL — ABNORMAL HIGH (ref 6–20)
CALCIUM: 9 mg/dL (ref 8.9–10.3)
CO2: 27 mmol/L (ref 22–32)
Chloride: 101 mmol/L (ref 101–111)
Creatinine, Ser: 2.22 mg/dL — ABNORMAL HIGH (ref 0.61–1.24)
GFR calc Af Amer: 29 mL/min — ABNORMAL LOW (ref >60)
GFR calc non Af Amer: 25 mL/min — ABNORMAL LOW (ref >60)
GLUCOSE: 115 mg/dL — AB (ref 65–99)
Potassium: 4.1 mmol/L (ref 3.5–5.1)
Sodium: 136 mmol/L (ref 135–145)

## 2017-01-28 LAB — CBC WITH DIFFERENTIAL/PLATELET
BASOS ABS: 0 10*3/uL (ref 0.0–0.1)
Basophils Relative: 0 %
Eosinophils Absolute: 0.1 10*3/uL (ref 0.0–0.7)
Eosinophils Relative: 1 %
HEMATOCRIT: 41.1 % (ref 39.0–52.0)
Hemoglobin: 14 g/dL (ref 13.0–17.0)
LYMPHS ABS: 1.8 10*3/uL (ref 0.7–4.0)
LYMPHS PCT: 14 %
MCH: 30.3 pg (ref 26.0–34.0)
MCHC: 34.1 g/dL (ref 30.0–36.0)
MCV: 89 fL (ref 78.0–100.0)
Monocytes Absolute: 1.1 10*3/uL — ABNORMAL HIGH (ref 0.1–1.0)
Monocytes Relative: 9 %
NEUTROS ABS: 9.4 10*3/uL — AB (ref 1.7–7.7)
Neutrophils Relative %: 76 %
Platelets: 178 10*3/uL (ref 150–400)
RBC: 4.62 MIL/uL (ref 4.22–5.81)
RDW: 13.5 % (ref 11.5–15.5)
WBC: 12.4 10*3/uL — ABNORMAL HIGH (ref 4.0–10.5)

## 2017-01-28 MED ORDER — TAMSULOSIN HCL 0.4 MG PO CAPS: 0.4000 mg | ORAL_CAPSULE | Freq: Every day | ORAL | 0 refills | Status: AC

## 2017-01-28 NOTE — ED Notes (Signed)
He tells me he feels "a lot better". He also states "I don't have that burning anymore".

## 2017-01-28 NOTE — ED Provider Notes (Signed)
[facility] DEPT Provider Note   CSN: [id number] Arrival date & time: 01/28/17  7939     History   Chief Complaint Chief Complaint  Patient presents with  . Urinary Retention    HPI Ronald [name] is a 83 y.o. male.  HPI   83 year old male with history of hypertension, hyperlipidemia, CKD, prostate CA who presents with urinary retention.  The patient states that he has had a mild cough over the last several days.  He has been taking cough medicine prescribed to him by the [place].  He states that over the last several days, he has noticed decreased urine output.  He was just diagnosed with prostate cancer 8 months ago but has not had workup for this.  Denies any lower extremity numbness or weakness.  He states that over the last 24 hours, he has had only several drops of urine and has had subsequent progressively worsening lower abdominal pain, fullness, that is aching and gnawing.  He has had associated nausea but no vomiting.  No diarrhea.  No fevers or chills.  His cough seems to be improving.  No history of urinary retention or Foley use in the past.   Per chart review, the patient was seen recently for a cough on 11/26 which was thought possibly secondary to lisinopril versus URI.  He had a normal chest x-ray.  No lab work.  Past Medical History:  Diagnosis Date  . Arthritis   . Chronic kidney disease   . GERD (gastroesophageal reflux disease)   . Hernia   . Hypercholesterolemia   . Hyperlipidemia   . Hypertension   . Varicose veins of lower extremities with complications 3 weeks ago bleeding episode from  vein  right ankle    There are no active problems to display for this patient.   Past Surgical History:  Procedure Laterality Date  . APPENDECTOMY    . CHOLECYSTECTOMY    . FOOT SURGERY    . HEMORROIDECTOMY    . VEIN LIGATION AND STRIPPING  right greater saphenous vein  about 10 years ago       Home Medications    Prior to  Admission medications   Medication Sig Start Date End Date Taking? Authorizing Provider  acetaminophen (TYLENOL) 325 MG tablet Take 650 mg by mouth every 6 (six) hours as needed for moderate pain.     [provider]  albuterol (PROVENTIL HFA;VENTOLIN HFA) 108 (90 Base) MCG/ACT inhaler Inhale 2 puffs into the lungs every 6 (six) hours as needed for wheezing or shortness of breath.    [provider]  aspirin 81 MG chewable tablet Chew 81 mg by mouth every evening.     [provider]  benzonatate (TESSALON) 100 MG capsule Take 1 capsule (100 mg total) by mouth every 8 (eight) hours. 01/16/17   Long, Wonda Olds, MD  diltiazem (CARDIZEM CD) 240 MG 24 hr capsule Take 240 mg by mouth every morning.     [provider]  fenofibrate 160 MG tablet Take 160 mg by mouth daily.    [provider]  fluticasone (FLONASE) 50 MCG/ACT nasal spray Place 1 spray into both nostrils daily. 02/06/14   [provider]  guaiFENesin (ROBITUSSIN) 100 MG/5ML liquid Take 5-10 mLs (100-200 mg total) by mouth every 4 (four) hours as needed for cough. 02/03/13   Domenic Moras, PA-C  hydrochlorothiazide 25 MG tablet Take 12.5 mg by mouth daily.     [provider]  lisinopril (PRINIVIL,ZESTRIL) 40 MG tablet Take 40 mg by mouth daily.    [provider]  meclizine (ANTIVERT) 25 MG tablet Take 25 mg by mouth 3 (three) times daily as needed for dizziness.    [provider]  omega-3 acid ethyl esters (LOVAZA) 1 G capsule Take 3 g by mouth 2 (two) times daily.      [provider]  omeprazole (PRILOSEC) 20 MG capsule Take 20 mg by mouth every morning.     [provider]  polyethylene glycol (MIRALAX / GLYCOLAX) packet Take 17 g by mouth daily. Patient taking differently: Take 17 g by mouth daily as needed for mild constipation.  01/27/14   Malvin Johns, MD  pravastatin (PRAVACHOL) 80 MG tablet Take 80 mg by mouth every morning.      [provider]  predniSONE (DELTASONE) 20 MG tablet Take 2 tablets (40 mg total) by mouth daily. 02/18/15   Little, Wenda Overland, MD  temazepam (RESTORIL) 30 MG capsule Take 30 mg by mouth at bedtime.    [provider]  tiotropium (SPIRIVA HANDIHALER) 18 MCG inhalation capsule Place 1 capsule (18 mcg total) into inhaler and inhale daily. 02/18/15   Little, Wenda Overland, MD    Family History No family history on file.  Social History Social History   Tobacco Use  . Smoking status: Former Smoker    Last attempt to quit: 02/21/1974    Years since quitting: 42.9  . Smokeless tobacco: Never Used  Substance Use Topics  . Alcohol use: Yes    Comment: occassional  2 beers per week  . Drug use: No     Allergies   Morphine and related and Niacin and related   Review of Systems Review of Systems  Constitutional: Positive for fatigue.  Respiratory: Positive for cough.   Gastrointestinal: Positive for abdominal pain and nausea.  Genitourinary: Positive for difficulty urinating.  All other systems reviewed and are negative.    Physical Exam Updated Vital Signs BP 131/85 (BP Location: Left Arm)   Pulse 85   Temp 98 F (36.7 C) (Oral)   Resp 20   Ht 5\' 9"  (1.753 m)   Wt 81.6 kg (180 lb)   SpO2 97%   BMI 26.58 kg/m   Physical Exam  Constitutional: He is oriented to person, place, and time. He appears well-developed and well-nourished. He appears distressed.  HENT:  Head: Normocephalic and atraumatic.  Eyes: Conjunctivae are normal.  Neck: Neck supple.  Cardiovascular: Normal rate, regular rhythm and normal heart sounds. Exam reveals no friction rub.  No murmur heard. Pulmonary/Chest: Effort normal and breath sounds normal. No respiratory distress. He has no wheezes. He has no rales.  Abdominal: Soft. He exhibits no distension. There is tenderness (Moderate suprapubic abdominal TTP and fullness). There is guarding. There is no rebound.  Genitourinary:    Genitourinary Comments: Circumcised, no lesions  Musculoskeletal: He exhibits no edema.  Neurological: He is alert and oriented to person, place, and time. He exhibits normal muscle tone.  Skin: Skin is warm. Capillary refill takes less than 2 seconds.  Psychiatric: He has a normal mood and affect.  Nursing note and vitals reviewed.    ED Treatments / Results  Labs (all labs ordered are listed, but only abnormal results are displayed) Labs Reviewed  CBC WITH DIFFERENTIAL/PLATELET - Abnormal; Notable for the following components:      Result Value   WBC 12.4 (*)    Neutro Abs 9.4 (*)  Monocytes Absolute 1.1 (*)    All other components within normal limits  BASIC METABOLIC PANEL - Abnormal; Notable for the following components:   Glucose, Bld 115 (*)    BUN 57 (*)    Creatinine, Ser 2.22 (*)    GFR calc non Af Amer 25 (*)    GFR calc Af Amer 29 (*)    All other components within normal limits  URINALYSIS, ROUTINE W REFLEX MICROSCOPIC - Abnormal; Notable for the following components:   Hgb urine dipstick SMALL (*)    Protein, ur 100 (*)    Bacteria, UA RARE (*)    All other components within normal limits    EKG  EKG Interpretation None       Radiology Dg Chest 2 View  Result Date: 01/28/2017 CLINICAL DATA:  Congestion and cough x 3-4 days; hx HTN; ex smoker, quit 1976 EXAM: CHEST - 2 VIEW COMPARISON:  01/16/2017 FINDINGS: Coarse linear markings in the lung bases as before. No confluent airspace infiltrate or overt edema. Heart size normal. No pulmonary vascular congestion. No pneumothorax. No effusion. Visualized bones unremarkable. IMPRESSION: No acute cardiopulmonary disease. Electronically Signed   By: Lucrezia Europe M.D.   On: 01/28/2017 09:36    Procedures Procedures (including critical care time)  Medications Ordered in ED Medications - No data to display   Initial Impression / Assessment and Plan / ED Course  I have reviewed the triage vital signs and the  nursing notes.  Pertinent labs & imaging results that were available during my care of the patient were reviewed by me and considered in my medical decision making (see chart for details).     83 year old male with history of prostate cancer, recently diagnosed, here with urinary retention.  I suspect this is secondary to use of over-the-counter cough medications and antihistamine effects.  A Foley catheter was placed with return of 800 cc of yellow urine.  He tolerated the procedure well and pain has resolved.  Otherwise, he has baseline chronic kidney disease but creatinine is similar to his last check.  He may have a mild component of obstructive AK I, but this has now resolved.  He has a mild, likely reactive leukocytosis.  He does report cough and was just seen several days ago for this.  Repeat chest x-ray is without focal pneumonia and is satting well on room air.  Urinalysis is without evidence of UTI.  Will leave Foley in place, refer to urology for follow-up.  This note was prepared with assistance of Systems analyst. Occasional wrong-word or sound-a-like substitutions may have occurred due to the inherent limitations of voice recognition software.   Final Clinical Impressions(s) / ED Diagnoses   Final diagnoses:  Urinary retention    ED Discharge Orders    None       Duffy Bruce, MD 01/28/17 1057

## 2017-01-28 NOTE — Discharge Instructions (Signed)
Follow-up with your urologist next week for Foley catheter removal.  Return to the ER with any new or concerning symptoms such as difficulty urinating, fever, flank pain on your sides, nausea or vomiting.  Drink at least 6-8 8 ounce glasses of water daily.

## 2017-01-28 NOTE — ED Triage Notes (Signed)
He tells [place] that he has been able to pass only very small amounts of urine for past few days. He further tells [place] he has prostate cancer.

## 2017-01-30 ENCOUNTER — Emergency Department (HOSPITAL_COMMUNITY)
Admission: EM | Admit: 2017-01-30 | Discharge: 2017-01-30 | Disposition: A | Discharge status: Home / Self Care | Payer: Medicare HMO | Attending: Emergency Medicine | Admitting: Emergency Medicine

## 2017-01-30 DIAGNOSIS — Z960 Presence of urogenital implants: Secondary | ICD-10-CM | POA: Insufficient documentation

## 2017-01-30 DIAGNOSIS — Z87891 Personal history of nicotine dependence: Secondary | ICD-10-CM | POA: Diagnosis not present

## 2017-01-30 DIAGNOSIS — Z8546 Personal history of malignant neoplasm of prostate: Secondary | ICD-10-CM | POA: Diagnosis not present

## 2017-01-30 DIAGNOSIS — T83198A Other mechanical complication of other urinary devices and implants, initial encounter: Secondary | ICD-10-CM | POA: Diagnosis not present

## 2017-01-30 DIAGNOSIS — I129 Hypertensive chronic kidney disease with stage 1 through stage 4 chronic kidney disease, or unspecified chronic kidney disease: Secondary | ICD-10-CM | POA: Diagnosis not present

## 2017-01-30 DIAGNOSIS — T83498A Other mechanical complication of other prosthetic devices, implants and grafts of genital tract, initial encounter: Secondary | ICD-10-CM | POA: Diagnosis not present

## 2017-01-30 DIAGNOSIS — Z79899 Other long term (current) drug therapy: Secondary | ICD-10-CM | POA: Diagnosis not present

## 2017-01-30 DIAGNOSIS — R3129 Other microscopic hematuria: Secondary | ICD-10-CM | POA: Diagnosis not present

## 2017-01-30 DIAGNOSIS — N189 Chronic kidney disease, unspecified: Secondary | ICD-10-CM | POA: Diagnosis not present

## 2017-01-30 DIAGNOSIS — Z7982 Long term (current) use of aspirin: Secondary | ICD-10-CM | POA: Insufficient documentation

## 2017-01-30 DIAGNOSIS — R319 Hematuria, unspecified: Secondary | ICD-10-CM | POA: Diagnosis present

## 2017-01-30 LAB — CBC WITH DIFFERENTIAL/PLATELET
BASOS ABS: 0 10*3/uL (ref 0.0–0.1)
Basophils Relative: 0 %
Eosinophils Absolute: 0.1 10*3/uL (ref 0.0–0.7)
Eosinophils Relative: 1 %
HCT: 39.9 % (ref 39.0–52.0)
Hemoglobin: 13.5 g/dL (ref 13.0–17.0)
Lymphocytes Relative: 12 %
Lymphs Abs: 1.5 10*3/uL (ref 0.7–4.0)
MCH: 30.3 pg (ref 26.0–34.0)
MCHC: 33.8 g/dL (ref 30.0–36.0)
MCV: 89.5 fL (ref 78.0–100.0)
MONOS PCT: 6 %
Monocytes Absolute: 0.8 10*3/uL (ref 0.1–1.0)
NEUTROS PCT: 81 %
Neutro Abs: 10.1 10*3/uL — ABNORMAL HIGH (ref 1.7–7.7)
Platelets: 167 10*3/uL (ref 150–400)
RBC: 4.46 MIL/uL (ref 4.22–5.81)
RDW: 13.5 % (ref 11.5–15.5)
WBC: 12.4 10*3/uL — ABNORMAL HIGH (ref 4.0–10.5)

## 2017-01-30 LAB — URINALYSIS, ROUTINE W REFLEX MICROSCOPIC
Bacteria, UA: NONE SEEN
SQUAMOUS EPITHELIAL / LPF: NONE SEEN

## 2017-01-30 LAB — BASIC METABOLIC PANEL
Anion gap: 8 (ref 5–15)
BUN: 51 mg/dL — ABNORMAL HIGH (ref 6–20)
CALCIUM: 9.2 mg/dL (ref 8.9–10.3)
CO2: 25 mmol/L (ref 22–32)
Chloride: 102 mmol/L (ref 101–111)
Creatinine, Ser: 2.1 mg/dL — ABNORMAL HIGH (ref 0.61–1.24)
GFR calc Af Amer: 31 mL/min — ABNORMAL LOW (ref >60)
GFR calc non Af Amer: 27 mL/min — ABNORMAL LOW (ref >60)
Glucose, Bld: 103 mg/dL — ABNORMAL HIGH (ref 65–99)
Potassium: 4.6 mmol/L (ref 3.5–5.1)
Sodium: 135 mmol/L (ref 135–145)

## 2017-01-30 NOTE — Discharge Instructions (Signed)
As discussed, your evaluation today has been largely reassuring.  But, it is important that you monitor your condition carefully, and do not hesitate to return to the ED if you develop new, or concerning changes in your condition. ? ?Otherwise, please follow-up with your physician for appropriate ongoing care. ? ?

## 2017-01-30 NOTE — ED Provider Notes (Signed)
[facility] DEPT Provider Note   CSN: [id number] Arrival date & time: 01/30/17  1315     History   Chief Complaint Chief Complaint  Patient presents with  . Hematuria    HPI Ronald [name] is a 83 y.o. male.  HPI  Patient presents 2 days after being evaluated here for cough and difficulty with urination, now with concern of hematuria. He states that he feels otherwise well, better since that evaluation. However, over the past 36 hours or so he has noticed blood in his Foley catheter bag. During his last evaluation patient had placement of the Foley catheter due to acute urinary retention. He acknowledges a history of prostate cancer, not currently receiving therapy for this. He denies other new changes, including syncope, chest pain, abdominal pain.   Past Medical History:  Diagnosis Date  . Arthritis   . Chronic kidney disease   . GERD (gastroesophageal reflux disease)   . Hernia   . Hypercholesterolemia   . Hyperlipidemia   . Hypertension   . Varicose veins of lower extremities with complications 3 weeks ago bleeding episode from  vein  right ankle    There are no active problems to display for this patient.   Past Surgical History:  Procedure Laterality Date  . APPENDECTOMY    . CHOLECYSTECTOMY    . FOOT SURGERY    . HEMORROIDECTOMY    . VEIN LIGATION AND STRIPPING  right greater saphenous vein  about 10 years ago       Home Medications    Prior to Admission medications   Medication Sig Start Date End Date Taking? Authorizing Provider  acetaminophen (TYLENOL) 325 MG tablet Take 650 mg by mouth every 6 (six) hours as needed for moderate pain.     [provider]  albuterol (PROVENTIL HFA;VENTOLIN HFA) 108 (90 Base) MCG/ACT inhaler Inhale 2 puffs into the lungs every 6 (six) hours as needed for wheezing or shortness of breath.    [provider]  aspirin 81 MG chewable tablet Chew 81 mg by mouth  every evening.     [provider]  benzonatate (TESSALON) 100 MG capsule Take 1 capsule (100 mg total) by mouth every 8 (eight) hours. 01/16/17   Long, Wonda Olds, MD  diltiazem (CARDIZEM CD) 240 MG 24 hr capsule Take 240 mg by mouth every morning.     [provider]  fenofibrate 160 MG tablet Take 160 mg by mouth daily.    [provider]  fluticasone (FLONASE) 50 MCG/ACT nasal spray Place 1 spray into both nostrils daily. 02/06/14   [provider]  guaiFENesin (ROBITUSSIN) 100 MG/5ML liquid Take 5-10 mLs (100-200 mg total) by mouth every 4 (four) hours as needed for cough. 02/03/13   Domenic Moras, PA-C  hydrochlorothiazide 25 MG tablet Take 12.5 mg by mouth daily.     [provider]  lisinopril (PRINIVIL,ZESTRIL) 40 MG tablet Take 40 mg by mouth daily.    [provider]  meclizine (ANTIVERT) 25 MG tablet Take 25 mg by mouth 3 (three) times daily as needed for dizziness.    [provider]  omega-3 acid ethyl esters (LOVAZA) 1 G capsule Take 3 g by mouth 2 (two) times daily.      [provider]  omeprazole (PRILOSEC) 20 MG capsule Take 20 mg by mouth every morning.     [provider]  polyethylene glycol (MIRALAX / GLYCOLAX) packet Take 17 g by mouth daily. Patient  taking differently: Take 17 g by mouth daily as needed for mild constipation.  01/27/14   Malvin Johns, MD  pravastatin (PRAVACHOL) 80 MG tablet Take 80 mg by mouth every morning.     [provider]  predniSONE (DELTASONE) 20 MG tablet Take 2 tablets (40 mg total) by mouth daily. 02/18/15   Little, Wenda Overland, MD  tamsulosin (FLOMAX) 0.4 MG CAPS capsule Take 1 capsule (0.4 mg total) by mouth daily for 7 days. 01/28/17 02/04/17  Duffy Bruce, MD  temazepam (RESTORIL) 30 MG capsule Take 30 mg by mouth at bedtime.    [provider]  tiotropium (SPIRIVA HANDIHALER) 18 MCG inhalation capsule Place 1 capsule (18 mcg total) into  inhaler and inhale daily. 02/18/15   Little, Wenda Overland, MD    Family History No family history on file.  Social History Social History   Tobacco Use  . Smoking status: Former Smoker    Last attempt to quit: 02/21/1974    Years since quitting: 42.9  . Smokeless tobacco: Never Used  Substance Use Topics  . Alcohol use: Yes    Comment: occassional  2 beers per week  . Drug use: No     Allergies   Morphine and related and Niacin and related   Review of Systems Review of Systems  Constitutional:       Per HPI, otherwise negative  HENT:       Per HPI, otherwise negative  Respiratory:       Per HPI, otherwise negative  Cardiovascular:       Per HPI, otherwise negative  Gastrointestinal: Negative for vomiting.  Endocrine:       Negative aside from HPI  Genitourinary:       Neg aside from HPI   Musculoskeletal:       Per HPI, otherwise negative  Skin: Negative.   Allergic/Immunologic:       Prostate cancer with no ongoing therapy  Neurological: Negative for syncope.     Physical Exam Updated Vital Signs BP (!) 151/80 (BP Location: Left Arm)   Pulse 91   Temp (!) 97.5 F (36.4 C) (Oral)   Resp 18   SpO2 97%   Physical Exam  Constitutional: He is oriented to person, place, and time. He appears well-developed. No distress.  HENT:  Head: Normocephalic and atraumatic.  Eyes: Conjunctivae and EOM are normal.  Cardiovascular: Normal rate and regular rhythm.  Pulmonary/Chest: Effort normal. No stridor. No respiratory distress.  Abdominal: He exhibits no distension.  Genitourinary:  Genitourinary Comments: Foley leg bag with red tinged urine  Musculoskeletal: He exhibits no edema.  Neurological: He is alert and oriented to person, place, and time.  Skin: Skin is warm and dry.  Psychiatric: He has a normal mood and affect.  Nursing note and vitals reviewed.    ED Treatments / Results  Labs (all labs ordered are listed, but only abnormal results are  displayed) Labs Reviewed  URINALYSIS, ROUTINE W REFLEX MICROSCOPIC - Abnormal; Notable for the following components:      Result Value   Color, Urine RED (*)    APPearance CLOUDY (*)    Glucose, UA   (*)    Value: TEST NOT REPORTED DUE TO COLOR INTERFERENCE OF URINE PIGMENT   Hgb urine dipstick   (*)    Value: TEST NOT REPORTED DUE TO COLOR INTERFERENCE OF URINE PIGMENT   Bilirubin Urine   (*)    Value: TEST NOT REPORTED DUE TO COLOR INTERFERENCE OF  URINE PIGMENT   Ketones, ur   (*)    Value: TEST NOT REPORTED DUE TO COLOR INTERFERENCE OF URINE PIGMENT   Protein, ur   (*)    Value: TEST NOT REPORTED DUE TO COLOR INTERFERENCE OF URINE PIGMENT   Nitrite   (*)    Value: TEST NOT REPORTED DUE TO COLOR INTERFERENCE OF URINE PIGMENT   Leukocytes, UA   (*)    Value: TEST NOT REPORTED DUE TO COLOR INTERFERENCE OF URINE PIGMENT   All other components within normal limits  CBC WITH DIFFERENTIAL/PLATELET - Abnormal; Notable for the following components:   WBC 12.4 (*)    Neutro Abs 10.1 (*)    All other components within normal limits  BASIC METABOLIC PANEL - Abnormal; Notable for the following components:   Glucose, Bld 103 (*)    BUN 51 (*)    Creatinine, Ser 2.10 (*)    GFR calc non Af Amer 27 (*)    GFR calc Af Amer 31 (*)    All other components within normal limits    Procedures Procedures (including critical care time)    Initial Impression / Assessment and Plan / ED Course  I have reviewed the triage vital signs and the nursing notes.  Pertinent labs & imaging results that were available during my care of the patient were reviewed by me and considered in my medical decision making (see chart for details).  I reviewed the patient's chart including documentation from 2 days ago, with placement of Foley catheter for acute urinary retention.  4:06 PM Patient in no distress, hungry. He has no complaints. We discussed all findings including evidence for ongoing renal  dysfunction, and the importance of following up with primary care. With no evidence for bacteremia, sepsis, the patient will follow up with his physician, as scheduled in 2 days.  Final Clinical Impressions(s) / ED Diagnoses   Final diagnoses:  Other microscopic hematuria     Carmin Muskrat, MD 01/30/17 [phone]

## 2017-01-30 NOTE — ED Notes (Signed)
Bed: GD92 Expected date:  Expected time:  Means of arrival:  Comments: EMS-hematuria

## 2017-01-30 NOTE — ED Triage Notes (Addendum)
Per EMS, pt is coming from home with complaints of blood in his urine. Pt reports recently being catheterized this past weekend for urinary retention. Pt also being treated for URI with antibiotic for 3 weeks. EMS reports hearing rhonchi. Pt has a hx of prostate cancer and HTN. Pt AO x4.

## 2017-01-31 ENCOUNTER — Other Ambulatory Visit: Payer: Self-pay | Admitting: *Deleted

## 2017-01-31 NOTE — Patient Outreach (Signed)
[facility]) Care Management  01/31/2017  Ronald [name] 1929-10-29 [id number]  Telephone Screen  Referral Date:  01/31/2017 Referral Source:  [facility] ED Census Reason for Referral:  6 or more ED visits in the past 6 months Insurance:  [employer] and [employer]   Outreach Attempt:  Outreach attempt #1 to patient to complete telephone screening. No answer. RN Health Coach left HIPAA compliant voicemail message along with contact information.  Plan:  RN Health Coach to make another telephone screening attempt within the next 3 business days.   [place] [phone] [email or web address]

## 2017-02-01 ENCOUNTER — Encounter: Payer: Self-pay | Admitting: *Deleted

## 2017-02-01 ENCOUNTER — Other Ambulatory Visit: Payer: Self-pay | Admitting: *Deleted

## 2017-02-01 NOTE — Patient Outreach (Signed)
[facility]) Care Management  02/01/2017  Ronald [name] Jun 22, 1929 [id number]  Telephone Screen  Referral Date:  01/31/2017 Referral Source:  [facility] ED Census Reason for Referral:  6 or more ED visits in the past 6 months Insurance:  [employer] and [employer]  Outreach attempt:  Successful telephone outreach to patient.  HIPAA verified.  [facility] services reviewed and discussed with patient.  Patient declines screening process and services at this time.  Patient encouraged to contact [facility] in the future if services are needed.  Plan: RN Health Coach will send patient Successful Outreach Letter with pamphlet and magnet. RN Health Coach encouraged patient to utilize 24 hour Nurse Line. RN Health Coach will notify [facility] administrative assistant of case closure status.  [place] [phone] [email or web address]

## 2017-02-16 DIAGNOSIS — L84 Corns and callosities: Secondary | ICD-10-CM | POA: Diagnosis not present

## 2017-02-16 DIAGNOSIS — L602 Onychogryphosis: Secondary | ICD-10-CM | POA: Diagnosis not present

## 2017-03-04 DIAGNOSIS — H6981 Other specified disorders of Eustachian tube, right ear: Secondary | ICD-10-CM | POA: Diagnosis not present

## 2017-03-14 DIAGNOSIS — H6980 Other specified disorders of Eustachian tube, unspecified ear: Secondary | ICD-10-CM | POA: Diagnosis not present

## 2017-03-14 DIAGNOSIS — J329 Chronic sinusitis, unspecified: Secondary | ICD-10-CM | POA: Diagnosis not present

## 2017-05-22 DEATH — deceased

## 2018-08-15 IMAGING — CR DG CHEST 2V
4 series · 4 of 4 positions shown · non-contrast
Comparison: 01/16/2017

CLINICAL DATA: Congestion and cough x 3-4 days; hx HTN; ex smoker,
quit 0718

EXAM:
CHEST - 2 VIEW

[w chest lat (1 of 2)]
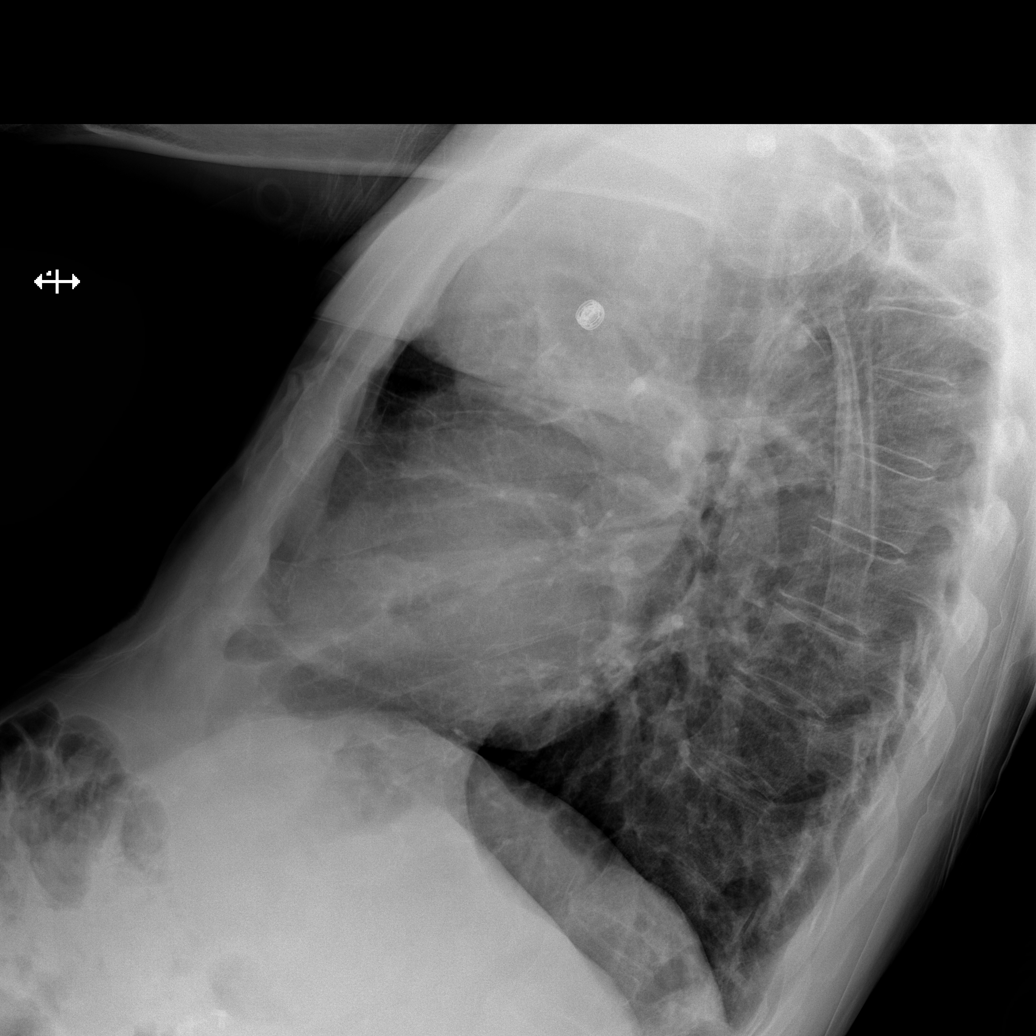

[w chest lat (2 of 2)]
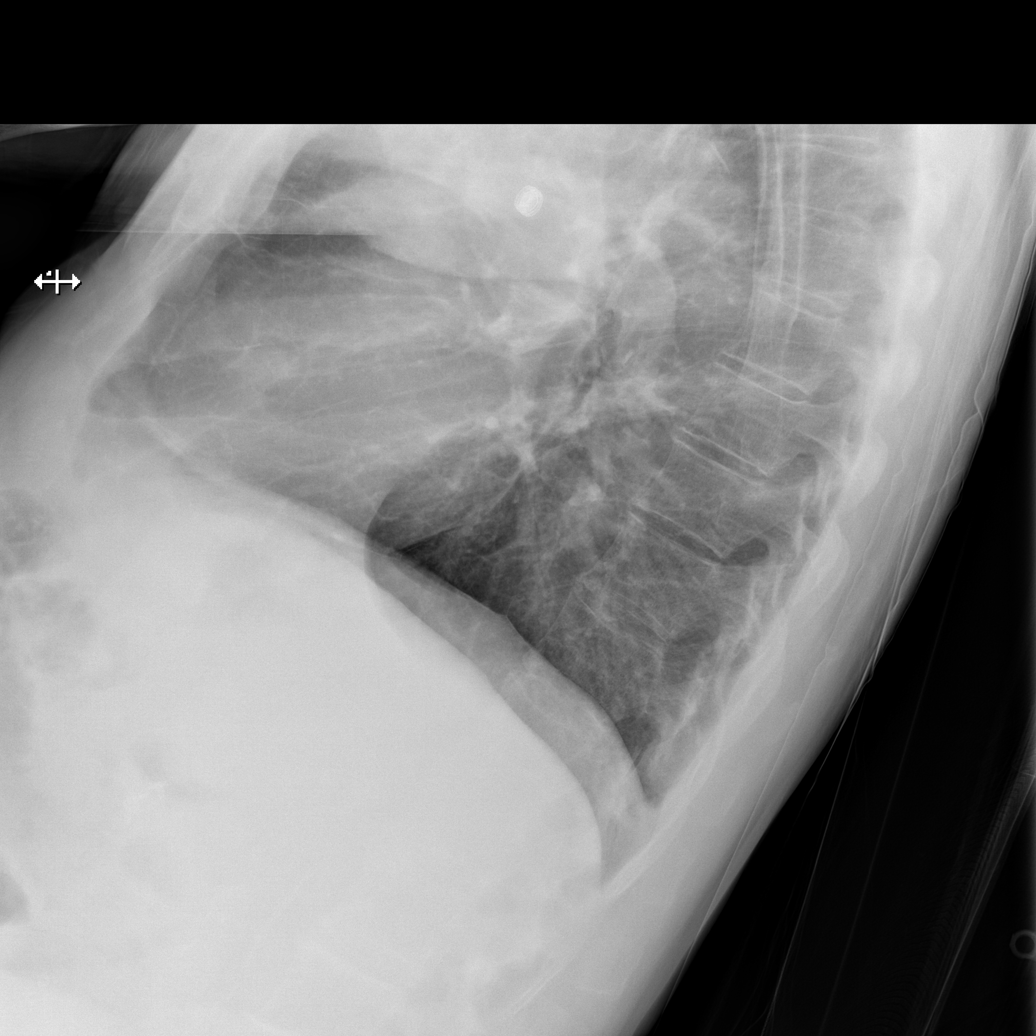

[x chest ap (1 of 2)]
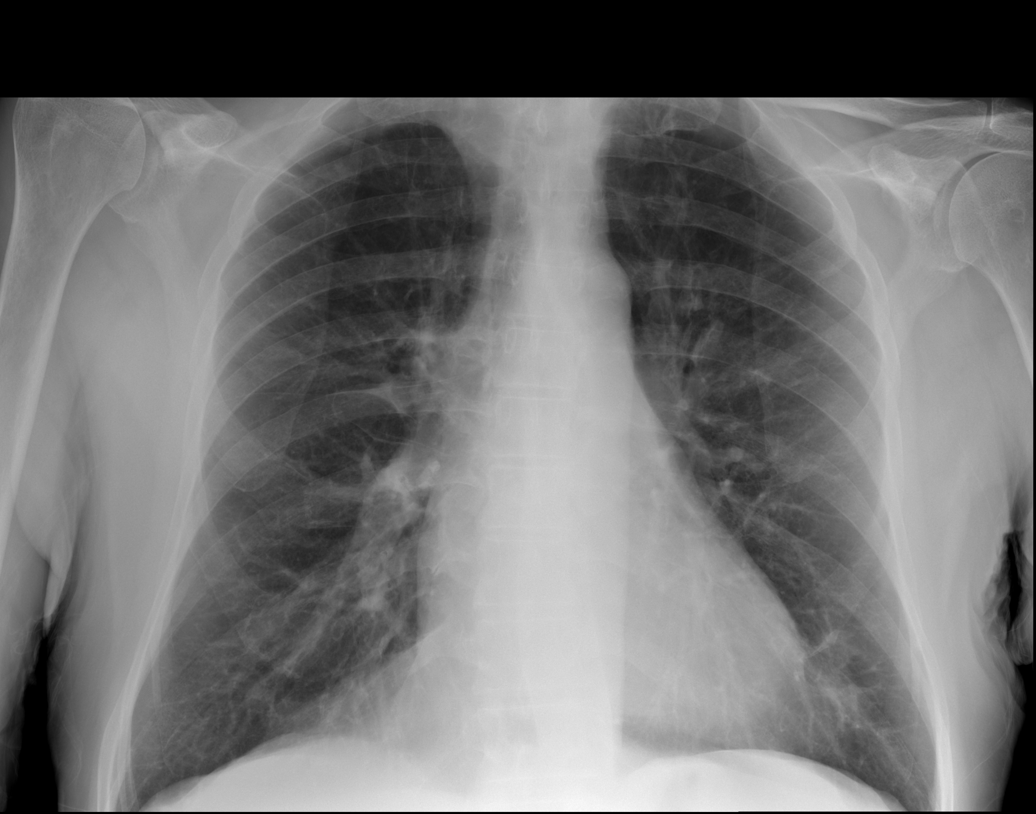

[x chest ap (2 of 2)]
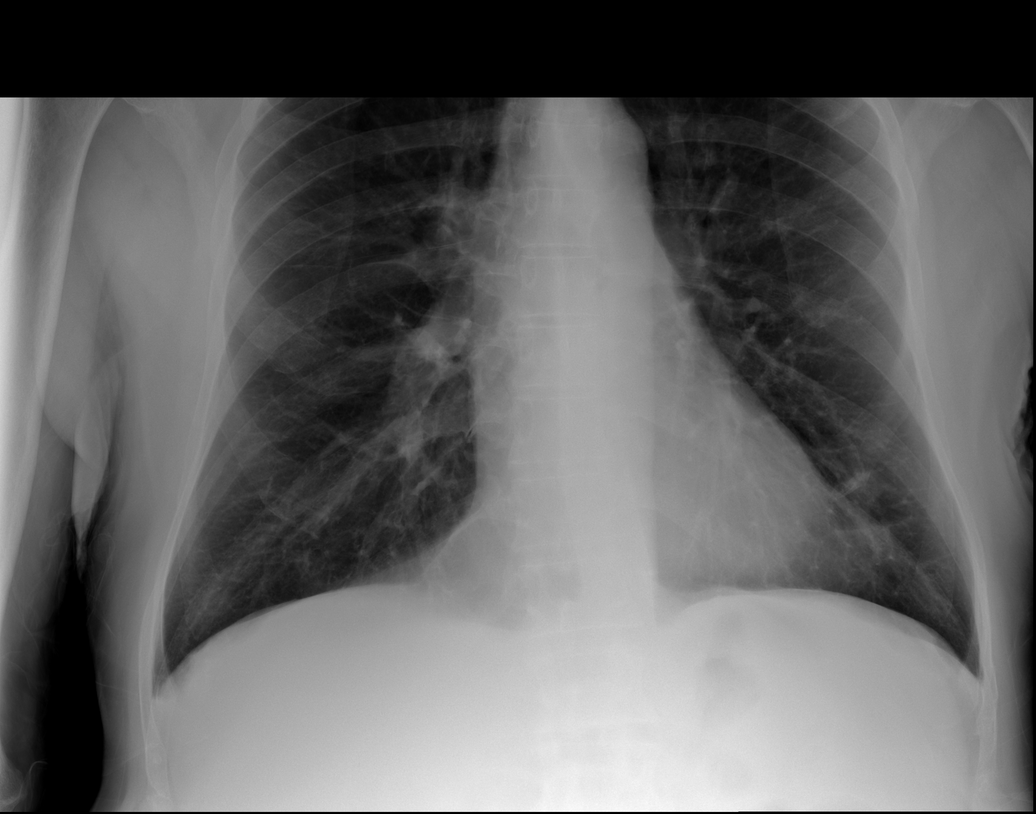

[4 of 4 positions shown; findings below may reference images not displayed]

FINDINGS: Coarse linear markings in the lung bases as before. No confluent
airspace infiltrate or overt edema.

Heart size normal. No pulmonary vascular congestion. No
pneumothorax.

No effusion.

Visualized bones unremarkable.
IMPRESSION: No acute cardiopulmonary disease.
